# Patient Record
Sex: Female | Born: 1990 | Race: White | Hispanic: No | Marital: Single | Smoking: Former smoker
Health system: Southern US, Community
[De-identification: ages and names within clinical notes are randomized; demographics above are authoritative.]

## PROBLEM LIST (undated history)

## (undated) ENCOUNTER — Inpatient Hospital Stay (HOSPITAL_COMMUNITY): Payer: Medicaid Other

## (undated) ENCOUNTER — Inpatient Hospital Stay (HOSPITAL_COMMUNITY): Payer: Self-pay

## (undated) DIAGNOSIS — Z98891 History of uterine scar from previous surgery: Secondary | ICD-10-CM

## (undated) DIAGNOSIS — D649 Anemia, unspecified: Secondary | ICD-10-CM

## (undated) DIAGNOSIS — E559 Vitamin D deficiency, unspecified: Secondary | ICD-10-CM

## (undated) DIAGNOSIS — Z789 Other specified health status: Secondary | ICD-10-CM

## (undated) DIAGNOSIS — E041 Nontoxic single thyroid nodule: Secondary | ICD-10-CM

## (undated) DIAGNOSIS — R87629 Unspecified abnormal cytological findings in specimens from vagina: Secondary | ICD-10-CM

## (undated) HISTORY — DX: Anemia, unspecified: D64.9

## (undated) HISTORY — PX: WISDOM TOOTH EXTRACTION: SHX21

## (undated) HISTORY — DX: Vitamin D deficiency, unspecified: E55.9

## (undated) HISTORY — DX: Unspecified abnormal cytological findings in specimens from vagina: R87.629

## (undated) HISTORY — DX: Nontoxic single thyroid nodule: E04.1

---
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
## 2011-08-02 ENCOUNTER — Emergency Department (HOSPITAL_COMMUNITY)
Admission: EM | Admit: 2011-08-02 | Discharge: 2011-08-02 | Disposition: A | Discharge status: Home / Self Care | Payer: Self-pay | Attending: Emergency Medicine | Admitting: Emergency Medicine

## 2011-08-02 ENCOUNTER — Encounter (HOSPITAL_COMMUNITY): Payer: Self-pay | Admitting: *Deleted

## 2011-08-02 DIAGNOSIS — F17200 Nicotine dependence, unspecified, uncomplicated: Secondary | ICD-10-CM | POA: Insufficient documentation

## 2011-08-02 DIAGNOSIS — H109 Unspecified conjunctivitis: Secondary | ICD-10-CM | POA: Insufficient documentation

## 2011-08-02 MED ORDER — TOBRAMYCIN 0.3 % OP SOLN: 1.0000 [drp] | OPHTHALMIC | Status: AC

## 2011-08-02 MED ORDER — DIPHENHYDRAMINE HCL 25 MG PO CAPS
25.0000 mg | ORAL_CAPSULE | Freq: Once | ORAL | Status: AC
  Administered 2011-08-02: 25 mg via ORAL
  Filled 2011-08-02: qty 1

## 2011-08-02 NOTE — ED Provider Notes (Signed)
Medical screening examination/treatment/procedure(s) were performed by non-physician practitioner and as supervising physician I was immediately available for consultation/collaboration.  Dagny Fiorentino, MD 08/02/11 1533 

## 2011-08-02 NOTE — ED Provider Notes (Signed)
History     CSN: [id number]  Arrival date & time 08/02/11  4098   First MD Initiated Contact with Patient 08/02/11 0710      Chief Complaint  Patient presents with  . Eye Pain    (Consider location/radiation/quality/duration/timing/severity/associated sxs/prior treatment) HPI  Patient presents to ER complaining of a 2 day hx of gradual onset left eye burning, itching, redness and blurry vision with some watery discharge but states that "if feels like the right eye may start now." Patient denies known injury or FB to eye. She denies contact lens or eye glasses use. She has taken no medication PTA for symptoms. She denies aggravating or alleviating factors. Symptoms were gradual onset, persistent and unchanging. Patient states she has no known medical problems and takes no meds on regular basis. She denies headaches, fevers, chills, double vision or visual field changes, purulent drainage from eyes or photophobia.   History reviewed. No pertinent past medical history.  History reviewed. No pertinent past surgical history.  Family History  Problem Relation Age of Onset  . Hypertension Mother   . Hypertension Father   . Cancer Other     History  Substance Use Topics  . Smoking status: Current Everyday Smoker -- 0.5 packs/day  . Smokeless tobacco: Not on file  . Alcohol Use: No    OB History    Grav Para Term Preterm Abortions TAB SAB Ect Mult Living                  Review of Systems  All other systems reviewed and are negative.    Allergies  Review of patient's allergies indicates no known allergies.  Home Medications  No current outpatient prescriptions on file.  BP 123/75  Pulse 85  Temp(Src) 98.5 F (36.9 C) (Oral)  Resp 16  SpO2 100%  LMP 07/14/2011  Physical Exam  Vitals reviewed. Constitutional: She is oriented to person, place, and time. She appears well-developed and well-nourished. No distress.  HENT:  Head: Normocephalic and atraumatic.  Right  Ear: External ear normal.  Left Ear: External ear normal.  Nose: Nose normal.  Mouth/Throat: No oropharyngeal exudate.       Mild erythema of posterior pharynx and tonsils no tonsillar exudate or enlargement. Patent airway. Swallowing secretions well  Eyes: EOM are normal. Pupils are equal, round, and reactive to light. Right eye exhibits no discharge. No foreign body present in the right eye. Left eye exhibits discharge. No foreign body present in the left eye. Right conjunctiva is not injected. Right conjunctiva has no hemorrhage. Left conjunctiva is injected. Left conjunctiva has no hemorrhage.  Fundoscopic exam:      The right eye shows no exudate, no hemorrhage and no papilledema.       The left eye shows no exudate, no hemorrhage and no papilledema.       Clear watery d/c from left eye  Neck: Normal range of motion. Neck supple.  Cardiovascular: Normal rate, regular rhythm and normal heart sounds.  Exam reveals no gallop and no friction rub.   No murmur heard. Pulmonary/Chest: Effort normal and breath sounds normal. No respiratory distress. She has no wheezes. She has no rales. She exhibits no tenderness.  Abdominal: Soft. She exhibits no distension and no mass. There is no tenderness. There is no rebound and no guarding.  Lymphadenopathy:    She has no cervical adenopathy.  Neurological: She is alert and oriented to person, place, and time. She has normal reflexes.  Skin: Skin  is warm and dry. No rash noted. She is not diaphoretic.  Psychiatric: She has a normal mood and affect.    ED Course  Procedures (including critical care time)  PO benadryl.   Labs Reviewed - No data to display No results found.   1. Conjunctivitis       MDM  Injection and watery d/c from left eye most consistent with conjunctivitis in a non contact lens wearing patient. Patient agreeable to opthalmology follow up but returning to ER for changing or worsening of symptoms.         Lenon Oms  [place], [place] 08/02/11 [phone]

## 2011-08-02 NOTE — ED Notes (Addendum)
C/o bilateral eye redness, started with L eye, L eye worse, redness noted & watery,  some redness noted to R eye. Denies drainage, exudate or crustiness. Denies fever. Admits to blurry vision.

## 2011-08-02 NOTE — Discharge Instructions (Signed)
Use eye drops as directed. Apply warm compresses to eye throughout the day. Take benadryl every 6 hours. Follow up with opthalmology referral in 2-3 days for recheck of ongoing symptoms but return to ER for emergent changing or worsening of symptoms.   Conjunctivitis Conjunctivitis is commonly called "pink eye." Conjunctivitis can be caused by bacterial or viral infection, allergies, or injuries. There is usually redness of the lining of the eye, itching, discomfort, and sometimes discharge. There may be deposits of matter along the eyelids. A viral infection usually causes a watery discharge, while a bacterial infection causes a yellowish, thick discharge. Pink eye is very contagious and spreads by direct contact. You may be given antibiotic eyedrops as part of your treatment. Before using your eye medicine, remove all drainage from the eye by washing gently with warm water and cotton balls. Continue to use the medication until you have awakened 2 mornings in a row without discharge from the eye. Do not rub your eye. This increases the irritation and helps spread infection. Use separate towels from other household members. Wash your hands with soap and water before and after touching your eyes. Use cold compresses to reduce pain and sunglasses to relieve irritation from light. Do not wear contact lenses or wear eye makeup until the infection is gone. SEEK MEDICAL CARE IF:   Your symptoms are not better after 3 days of treatment.   You have increased pain or trouble seeing.   The outer eyelids become very red or swollen.  Document Released: 04/24/2004 Document Revised: 03/06/2011 Document Reviewed: 03/17/2005 [facility] Patient Information 2012 [place], [place].

## 2011-10-13 ENCOUNTER — Encounter (HOSPITAL_COMMUNITY): Payer: Self-pay | Admitting: Emergency Medicine

## 2011-10-13 ENCOUNTER — Emergency Department (HOSPITAL_COMMUNITY)
Admission: EM | Admit: 2011-10-13 | Discharge: 2011-10-13 | Disposition: A | Discharge status: Home / Self Care | Payer: Self-pay | Attending: Emergency Medicine | Admitting: Emergency Medicine

## 2011-10-13 DIAGNOSIS — F17200 Nicotine dependence, unspecified, uncomplicated: Secondary | ICD-10-CM | POA: Insufficient documentation

## 2011-10-13 DIAGNOSIS — N390 Urinary tract infection, site not specified: Secondary | ICD-10-CM | POA: Insufficient documentation

## 2011-10-13 DIAGNOSIS — I10 Essential (primary) hypertension: Secondary | ICD-10-CM | POA: Insufficient documentation

## 2011-10-13 LAB — URINE MICROSCOPIC-ADD ON

## 2011-10-13 LAB — URINALYSIS, ROUTINE W REFLEX MICROSCOPIC
Glucose, UA: NEGATIVE mg/dL
Ketones, ur: NEGATIVE mg/dL
pH: 6 (ref 5.0–8.0)

## 2011-10-13 LAB — CBC
HCT: 40.4 % (ref 36.0–46.0)
MCH: 29.6 pg (ref 26.0–34.0)
MCV: 89.2 fL (ref 78.0–100.0)
RBC: 4.53 MIL/uL (ref 3.87–5.11)
WBC: 10.1 10*3/uL (ref 4.0–10.5)

## 2011-10-13 LAB — BASIC METABOLIC PANEL
CO2: 26 mEq/L (ref 19–32)
Chloride: 103 mEq/L (ref 96–112)
Glucose, Bld: 137 mg/dL — ABNORMAL HIGH (ref 70–99)
Sodium: 140 mEq/L (ref 135–145)

## 2011-10-13 MED ORDER — CEPHALEXIN 500 MG PO CAPS: 500.0000 mg | ORAL_CAPSULE | Freq: Four times a day (QID) | ORAL | Status: AC

## 2011-10-13 NOTE — ED Provider Notes (Signed)
History     CSN: [id number]  Arrival date & time 10/13/11  0547   First MD Initiated Contact with Patient 10/13/11 0601      Chief Complaint  Patient presents with  . Dizziness  . Fatigue  . Chest Pain    (Consider location/radiation/quality/duration/timing/severity/associated sxs/prior treatment) The history is provided by the patient.   21 year old female who presents for dizziness and feeling like "shit." The dizziness is approximately 6 days in duration and first occurred while she was seated on her car outside a bowling alley. She felt like she would lose consciousness, but never did. It resolved spontaneously. Since that time, she has felt similar symptoms after standing for long periods of time while at work in [employer]. She denies syncope. Accompanying symptoms include a right sided headache, left sided chest pain, lower abdominal pain, blurry vision and polyuria. She denies nausea, vomiting, and diarrhea and notes increasing her water consumption. She was initially concerned that she was pregnant, but two home pregnancy tests have been negative. She takes no medications. She uses marijuana, but notes that these symptoms have persisted for several days since last using it. She denies using any other recreational drugs or alcohol.   Past Medical History  Diagnosis Date  . Hypertension     History reviewed. No pertinent past surgical history.  Family History  Problem Relation Age of Onset  . Hypertension Mother   . Hypertension Father   . Cancer Other     History  Substance Use Topics  . Smoking status: Current Everyday Smoker -- 0.5 packs/day  . Smokeless tobacco: Not on file  . Alcohol Use: No    OB History    Grav Para Term Preterm Abortions TAB SAB Ect Mult Living   1    1  1    0      Review of Systems  All other systems reviewed and are negative.    Allergies  Review of patient's allergies indicates no known allergies.  Home Medications  No  current outpatient prescriptions on file.  BP 127/65  Pulse 98  Temp 98.5 F (36.9 C) (Oral)  Resp 14  SpO2 99%  Physical Exam  Constitutional: She is oriented to person, place, and time. She appears well-developed and well-nourished. No distress.  HENT:  Head: Normocephalic and atraumatic.  Mouth/Throat: No oropharyngeal exudate.  Eyes: Conjunctivae and EOM are normal. Pupils are equal, round, and reactive to light. Right eye exhibits no nystagmus. Left eye exhibits no nystagmus.  Neck: Normal range of motion. Neck supple.  Cardiovascular: Normal rate, regular rhythm and normal heart sounds.   Pulmonary/Chest: Effort normal and breath sounds normal. She exhibits tenderness.       Chest wall tenderness to palpations to the left of the sternum  Abdominal: Soft. Bowel sounds are normal. There is no hepatosplenomegaly. There is tenderness in the left lower quadrant. There is no CVA tenderness, no tenderness at McBurney's point and negative Murphy's sign.  Musculoskeletal: Normal range of motion. She exhibits no edema and no tenderness.  Neurological: She is alert and oriented to person, place, and time. No cranial nerve deficit.  Skin: She is not diaphoretic.    ED Course  Procedures (including critical care time)  Labs Reviewed  URINALYSIS, ROUTINE W REFLEX MICROSCOPIC - Abnormal; Notable for the following:    APPearance CLOUDY (*)     Hgb urine dipstick SMALL (*)     Leukocytes, UA MODERATE (*)     All  other components within normal limits  BASIC METABOLIC PANEL - Abnormal; Notable for the following:    Glucose, Bld 137 (*)     All other components within normal limits  URINE MICROSCOPIC-ADD ON - Abnormal; Notable for the following:    Squamous Epithelial / LPF FEW (*)     All other components within normal limits  PREGNANCY, URINE  CBC   No results found.   1. Urinary tract infection       MDM  The patient is a 21 year old with symptoms of dizziness and abdominal  pain coupled with abdominal tenderness on exam and leukocytes on her urinalysis. This likely represented a urinary tract infection, for which she was prescribed cephalexin. Her other lab tests were within normal limits, aside from her glucose which was 137. Given that she was fasting at the time, this reading could be diagnostic of diabetes. However, this would not be accurate in the setting of an untreated UTI. I instructed the patient to follow up with a doctor to have her blood glucose checked in approximately 1 week. She was in agreement with this plan.         Garnetta Buddy, MD 10/13/11 [phone]

## 2011-10-13 NOTE — ED Provider Notes (Signed)
I  reviewed the resident's note and I agree with the findings and plan.     Nelia Shi, MD 10/13/11 [phone]

## 2011-10-13 NOTE — ED Notes (Addendum)
Pt reports dizziness and generalized malaise over the last week reports neg preg test x2  Admits to nausea but denies emesis. Pt reports chest soreness that increases with deep breathing

## 2011-10-16 ENCOUNTER — Emergency Department (HOSPITAL_COMMUNITY)
Admission: EM | Admit: 2011-10-16 | Discharge: 2011-10-16 | Disposition: A | Discharge status: Home / Self Care | Payer: Self-pay | Attending: Emergency Medicine | Admitting: Emergency Medicine

## 2011-10-16 ENCOUNTER — Emergency Department (HOSPITAL_COMMUNITY): Payer: Self-pay

## 2011-10-16 ENCOUNTER — Encounter (HOSPITAL_COMMUNITY): Payer: Self-pay | Admitting: Emergency Medicine

## 2011-10-16 DIAGNOSIS — M79609 Pain in unspecified limb: Secondary | ICD-10-CM | POA: Insufficient documentation

## 2011-10-16 DIAGNOSIS — R0789 Other chest pain: Secondary | ICD-10-CM

## 2011-10-16 DIAGNOSIS — M25512 Pain in left shoulder: Secondary | ICD-10-CM

## 2011-10-16 DIAGNOSIS — R071 Chest pain on breathing: Secondary | ICD-10-CM | POA: Insufficient documentation

## 2011-10-16 DIAGNOSIS — M25519 Pain in unspecified shoulder: Secondary | ICD-10-CM | POA: Insufficient documentation

## 2011-10-16 DIAGNOSIS — M79605 Pain in left leg: Secondary | ICD-10-CM

## 2011-10-16 DIAGNOSIS — I10 Essential (primary) hypertension: Secondary | ICD-10-CM | POA: Insufficient documentation

## 2011-10-16 DIAGNOSIS — F17200 Nicotine dependence, unspecified, uncomplicated: Secondary | ICD-10-CM | POA: Insufficient documentation

## 2011-10-16 MED ORDER — HYDROCODONE-ACETAMINOPHEN 5-325 MG PO TABS: 2.0000 | ORAL_TABLET | ORAL | Status: AC | PRN

## 2011-10-16 NOTE — ED Notes (Addendum)
Pt was seen at [facility] on 10/13/11 with the same issues dealing with pain and was dx with UTI and sent home on antibiotics. All other test were WNL and pt was discharged.

## 2011-10-16 NOTE — ED Provider Notes (Signed)
History     CSN: [id number]  Arrival date & time 10/16/11  0038   First MD Initiated Contact with Patient 10/16/11 0243      Chief Complaint  Patient presents with  . Shoulder Pain    (Consider location/radiation/quality/duration/timing/severity/associated sxs/prior treatment) HPI Comments: Patient presenting with left arm pain, left sided neck pain, left sided chest pain, and left leg pain for the past week.  Pain gradually worsening.  No erythema or edema.  No numbness or tingling.  No weakness.  No acute injury or trauma.  Chest pain worse with deep inspiration.  Left shoulder pain worse with movement of left shoulder.  Neck pain worse with movement.  She has not taken anything for pain. She is currently not on any estrogen containing medications.  No prior history of DVT or PE.  No prolonged travel or surgeries in the past 4 weeks.    The history is provided by the patient.    Past Medical History  Diagnosis Date  . Hypertension     History reviewed. No pertinent past surgical history.  Family History  Problem Relation Age of Onset  . Hypertension Mother   . Hypertension Father   . Cancer Other     History  Substance Use Topics  . Smoking status: Current Everyday Smoker -- 0.5 packs/day  . Smokeless tobacco: Not on file  . Alcohol Use: Yes     occasional    OB History    Grav Para Term Preterm Abortions TAB SAB Ect Mult Living   1    1  1    0      Review of Systems  Constitutional: Negative for fever and chills.  HENT: Positive for neck pain.   Respiratory: Negative for shortness of breath.   Cardiovascular: Positive for chest pain. Negative for palpitations and leg swelling.  Gastrointestinal: Negative for nausea and vomiting.  Musculoskeletal: Negative for back pain and gait problem.       Pain of the left leg and left arm  Skin: Negative for rash and wound.  Neurological: Negative for dizziness, syncope and light-headedness.    Allergies  Review of  patient's allergies indicates no known allergies.  Home Medications   Current Outpatient Rx  Name Route Sig Dispense Refill  . CEPHALEXIN 500 MG PO CAPS Oral Take 1 capsule (500 mg total) by mouth 4 (four) times daily. 14 capsule 0  . HYDROCODONE-ACETAMINOPHEN 5-325 MG PO TABS Oral Take 2 tablets by mouth every 4 (four) hours as needed for pain. 15 tablet 0    BP 135/80  Pulse 98  Temp 98.7 F (37.1 C) (Oral)  Resp 18  Ht 5\' 4"  (1.626 m)  Wt 130 lb (58.968 kg)  BMI 22.31 kg/m2  SpO2 97%  LMP 09/25/2011  Physical Exam  Nursing note and vitals reviewed. Constitutional: She appears well-developed and well-nourished. No distress.  HENT:  Head: Normocephalic and atraumatic.  Mouth/Throat: Oropharynx is clear and moist.  Neck: Normal range of motion. Neck supple.       Left sided muscular tenderness of the neck.  Cardiovascular: Normal rate, regular rhythm, normal heart sounds and intact distal pulses.   Pulmonary/Chest: Effort normal and breath sounds normal. No accessory muscle usage. Not tachypneic. No respiratory distress. She has no decreased breath sounds. She has no wheezes. She has no rhonchi. She has no rales. She exhibits tenderness.       Tenderness to palpation of the left chest  Musculoskeletal: Normal range of motion.  Left shoulder: She exhibits tenderness. She exhibits normal range of motion, no bony tenderness, no swelling, no effusion, no deformity, normal pulse and normal strength.       Left hip: She exhibits tenderness. She exhibits normal range of motion, normal strength, no bony tenderness, no swelling and no deformity.  Neurological: She is alert.  Skin: Skin is warm and dry. No rash noted. She is not diaphoretic. No erythema.  Psychiatric: She has a normal mood and affect.    ED Course  Procedures (including critical care time)  Labs Reviewed - No data to display Dg Chest 2 View  10/16/2011  *RADIOLOGY REPORT*  Clinical Data: Fever and body aches  for 1 week.  CHEST - 2 VIEW  Comparison: 07/22/2004  Findings: The heart size and pulmonary vascularity are normal. The lungs appear clear and expanded without focal air space disease or consolidation. No blunting of the costophrenic angles.  No pneumothorax.  IMPRESSION: No evidence of active pulmonary disease.  Original Report Authenticated By: Marlon Pel, M.D.     1. Left shoulder pain   2. Left leg pain   3. Left-sided chest wall pain      Date: 10/16/2011  Rate: 101  Rhythm: sinus tachycardia  QRS Axis: normal  Intervals: normal  ST/T Wave abnormalities: normal  Conduction Disutrbances:none  Narrative Interpretation:   Old EKG Reviewed: none available   MDM  Patient presenting with pain of the left side of her neck, left side of her chest, left arm, and left leg.  Symptoms have been present for the past week.  Normal EKG.  Negative CXR.  Normal ROM of all extremities.  Normal Neurological exam.  Therefore, feel that patient can be discharged home with PCP follow up.        Pascal Lux [place], PA-C 10/18/11 [phone]

## 2011-10-16 NOTE — ED Notes (Signed)
Pt c/o pain to L shoulder, L ribs with deep inspiration, denies injury

## 2011-10-21 NOTE — ED Provider Notes (Signed)
Medical screening examination/treatment/procedure(s) were performed by non-physician practitioner and as supervising physician I was immediately available for consultation/collaboration.  Izaiah Tabb M Ivory Maduro, MD 10/21/11 0729 

## 2013-05-02 IMAGING — CR DG CHEST 2V
2 series · 2 of 2 positions shown · non-contrast
Comparison: 07/22/2004

CLINICAL DATA: Fever and body aches for 1 week.

CHEST - 2 VIEW

[w chest pa]
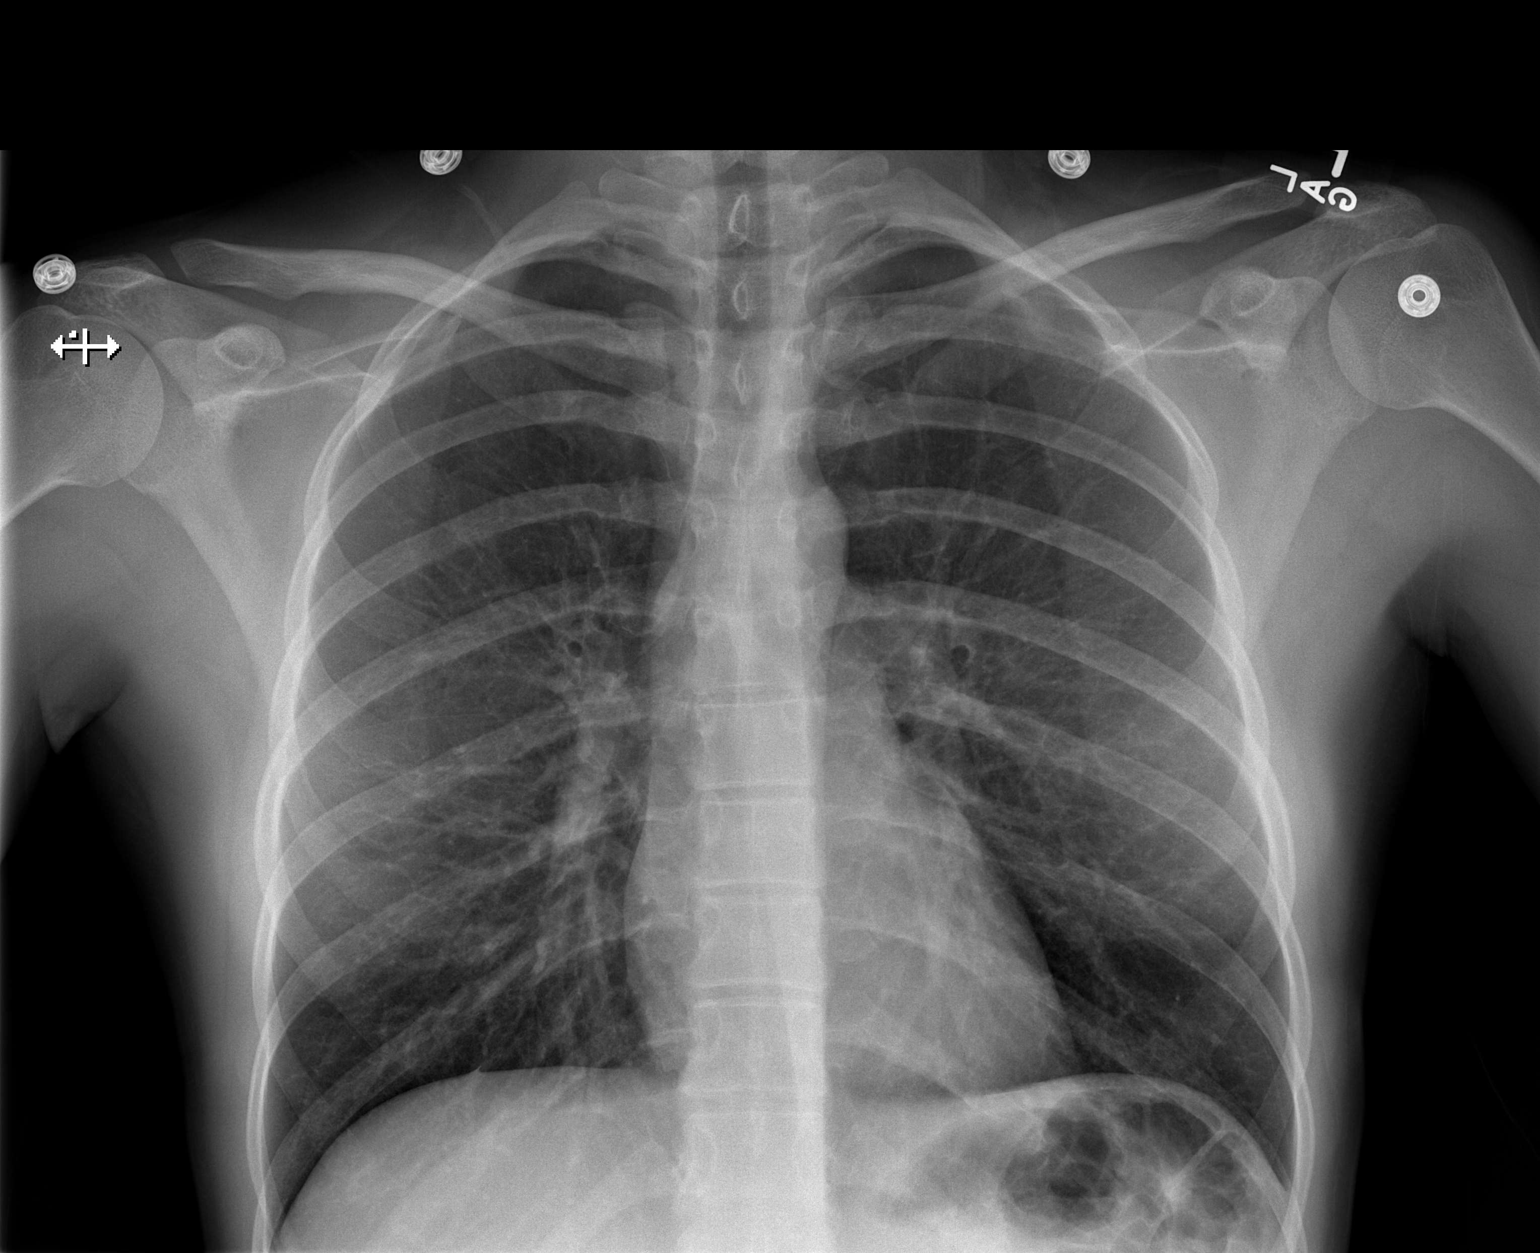

[w chest lat]
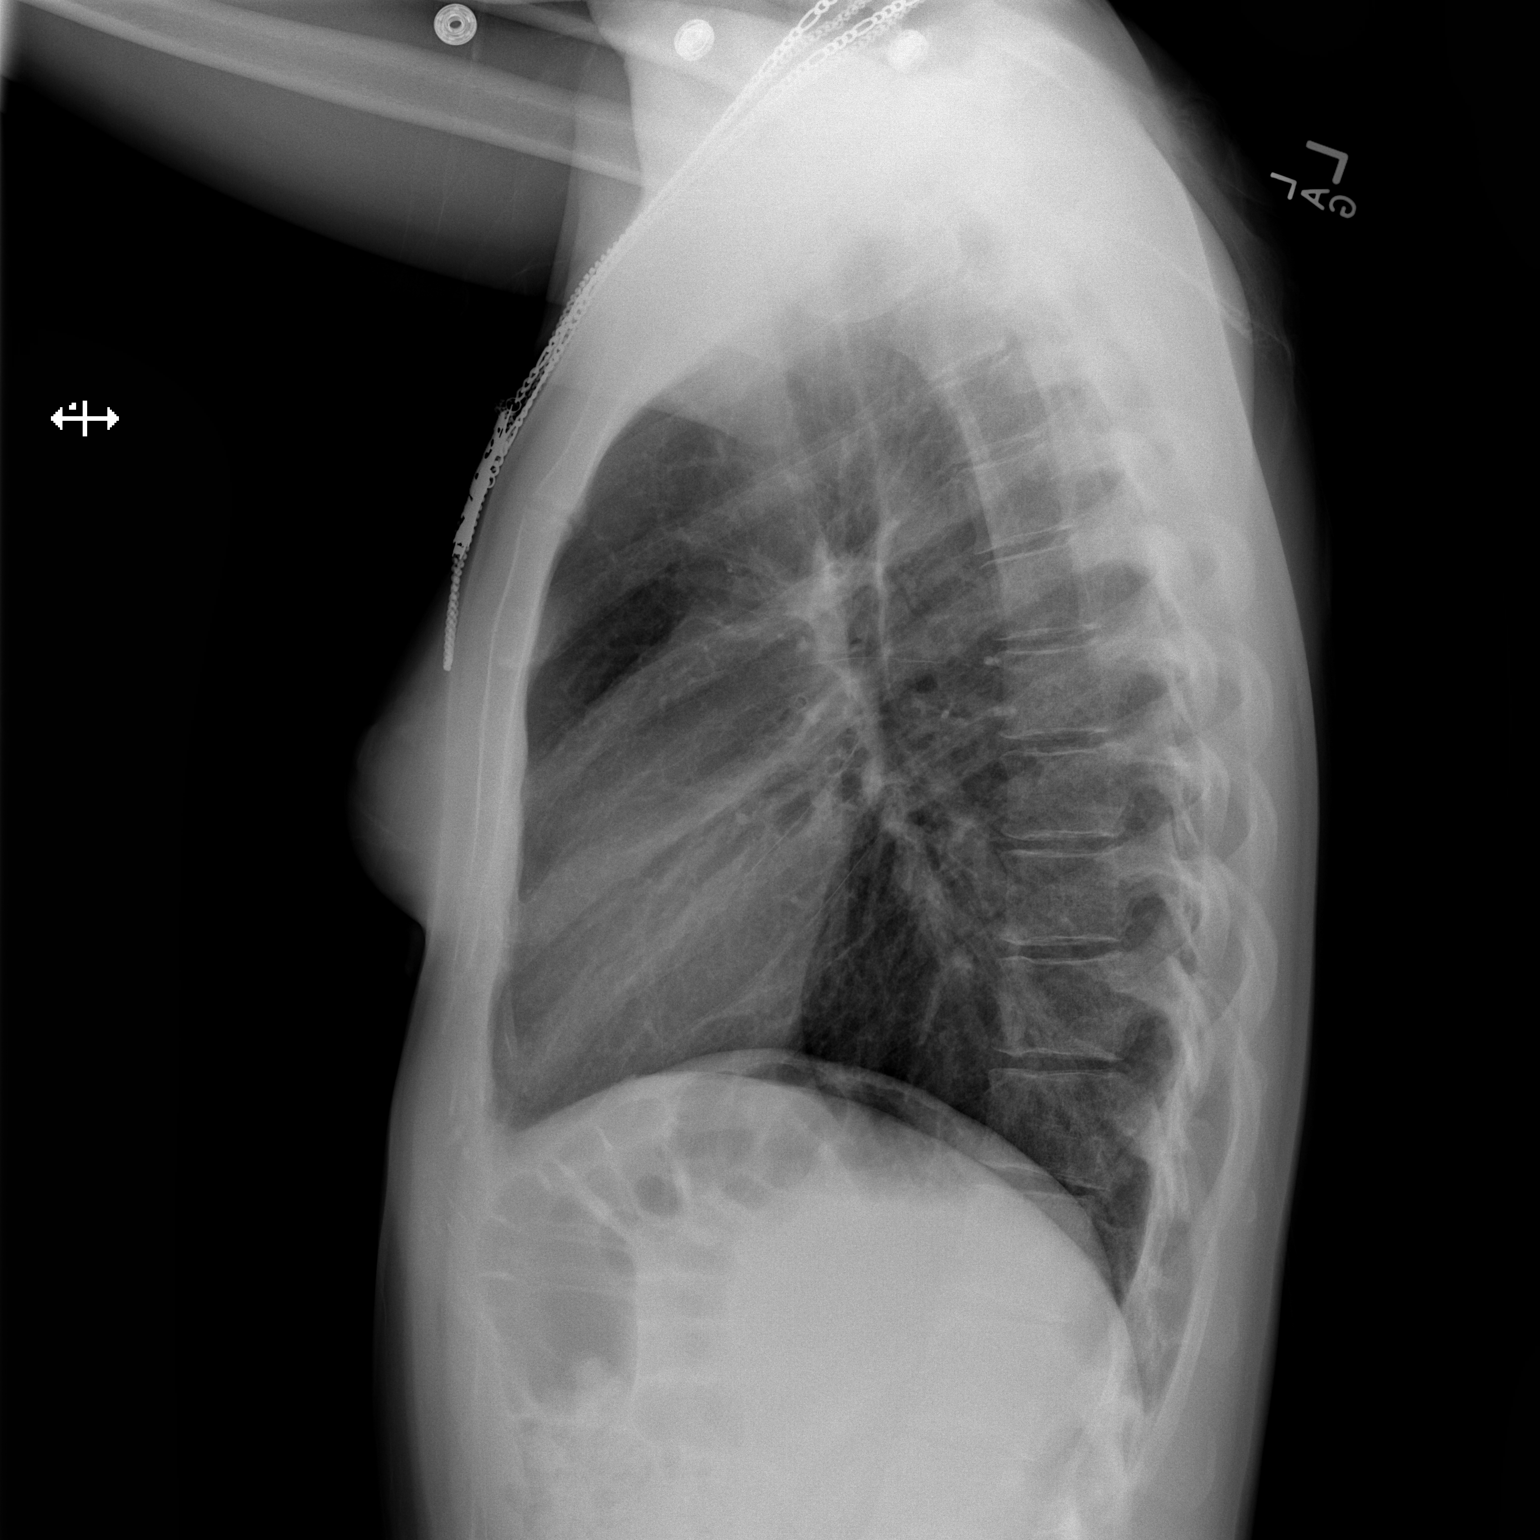

[2 of 2 positions shown; findings below may reference images not displayed]

FINDINGS: The heart size and pulmonary vascularity are normal. The
lungs appear clear and expanded without focal air space disease or
consolidation. No blunting of the costophrenic angles.  No
pneumothorax.
IMPRESSION: No evidence of active pulmonary disease.

## 2013-05-24 LAB — OB RESULTS CONSOLE RPR: RPR: NONREACTIVE

## 2013-05-24 LAB — OB RESULTS CONSOLE ABO/RH: RH TYPE: POSITIVE

## 2013-05-24 LAB — OB RESULTS CONSOLE GC/CHLAMYDIA
Chlamydia: NEGATIVE
Gonorrhea: NEGATIVE

## 2013-05-24 LAB — OB RESULTS CONSOLE RUBELLA ANTIBODY, IGM: RUBELLA: NON-IMMUNE/NOT IMMUNE

## 2013-05-24 LAB — OB RESULTS CONSOLE HEPATITIS B SURFACE ANTIGEN: HEP B S AG: NEGATIVE

## 2013-05-24 LAB — OB RESULTS CONSOLE HIV ANTIBODY (ROUTINE TESTING): HIV: NONREACTIVE

## 2013-05-24 LAB — OB RESULTS CONSOLE ANTIBODY SCREEN: Antibody Screen: NEGATIVE

## 2013-06-07 ENCOUNTER — Other Ambulatory Visit (HOSPITAL_COMMUNITY): Payer: Self-pay | Admitting: Obstetrics and Gynecology

## 2013-06-07 DIAGNOSIS — E041 Nontoxic single thyroid nodule: Secondary | ICD-10-CM

## 2013-06-10 ENCOUNTER — Ambulatory Visit (HOSPITAL_COMMUNITY)
Admission: RE | Admit: 2013-06-10 | Discharge: 2013-06-10 | Disposition: A | Discharge status: Home / Self Care | Payer: Medicaid Other | Source: Ambulatory Visit | Attending: Obstetrics and Gynecology | Admitting: Obstetrics and Gynecology

## 2013-06-10 DIAGNOSIS — E041 Nontoxic single thyroid nodule: Secondary | ICD-10-CM

## 2013-06-10 DIAGNOSIS — E079 Disorder of thyroid, unspecified: Secondary | ICD-10-CM | POA: Insufficient documentation

## 2013-06-13 ENCOUNTER — Other Ambulatory Visit: Payer: Self-pay | Admitting: Obstetrics and Gynecology

## 2013-06-13 DIAGNOSIS — E041 Nontoxic single thyroid nodule: Secondary | ICD-10-CM

## 2013-06-15 ENCOUNTER — Ambulatory Visit
Admission: RE | Admit: 2013-06-15 | Discharge: 2013-06-15 | Disposition: A | Discharge status: Home / Self Care | Payer: Medicaid Other | Source: Ambulatory Visit | Attending: Obstetrics and Gynecology | Admitting: Obstetrics and Gynecology

## 2013-06-15 ENCOUNTER — Other Ambulatory Visit (HOSPITAL_COMMUNITY)
Admission: RE | Admit: 2013-06-15 | Discharge: 2013-06-15 | Disposition: A | Discharge status: Home / Self Care | Payer: Medicaid Other | Source: Ambulatory Visit | Attending: Diagnostic Radiology | Admitting: Diagnostic Radiology

## 2013-06-15 DIAGNOSIS — E041 Nontoxic single thyroid nodule: Secondary | ICD-10-CM

## 2013-08-24 ENCOUNTER — Encounter (HOSPITAL_COMMUNITY): Payer: Self-pay | Admitting: *Deleted

## 2013-08-24 ENCOUNTER — Inpatient Hospital Stay (HOSPITAL_COMMUNITY)
Admission: AD | Admit: 2013-08-24 | Discharge: 2013-08-25 | Disposition: A | Discharge status: Home / Self Care | Payer: Medicaid Other | Source: Ambulatory Visit | Attending: Obstetrics and Gynecology | Admitting: Obstetrics and Gynecology

## 2013-08-24 DIAGNOSIS — O99891 Other specified diseases and conditions complicating pregnancy: Secondary | ICD-10-CM | POA: Insufficient documentation

## 2013-08-24 DIAGNOSIS — L559 Sunburn, unspecified: Secondary | ICD-10-CM | POA: Insufficient documentation

## 2013-08-24 DIAGNOSIS — L551 Sunburn of second degree: Secondary | ICD-10-CM

## 2013-08-24 DIAGNOSIS — O10019 Pre-existing essential hypertension complicating pregnancy, unspecified trimester: Secondary | ICD-10-CM | POA: Insufficient documentation

## 2013-08-24 DIAGNOSIS — O9989 Other specified diseases and conditions complicating pregnancy, childbirth and the puerperium: Principal | ICD-10-CM

## 2013-08-24 DIAGNOSIS — O99330 Smoking (tobacco) complicating pregnancy, unspecified trimester: Secondary | ICD-10-CM | POA: Insufficient documentation

## 2013-08-24 MED ORDER — BENZOCAINE-MENTHOL 20-0.5 % EX AERO
1.0000 "application " | INHALATION_SPRAY | Freq: Once | CUTANEOUS | Status: AC
  Administered 2013-08-25: 1 via TOPICAL
  Filled 2013-08-24: qty 56

## 2013-08-24 MED ORDER — IBUPROFEN 600 MG PO TABS
600.0000 mg | ORAL_TABLET | Freq: Once | ORAL | Status: AC
  Administered 2013-08-25: 600 mg via ORAL
  Filled 2013-08-24 (×2): qty 1

## 2013-08-24 NOTE — MAU Note (Signed)
Went to the pool for about 1.5 hours. No sunscreen.  Denies VB/LOF/contractions

## 2013-08-25 DIAGNOSIS — L551 Sunburn of second degree: Secondary | ICD-10-CM

## 2013-08-25 MED ORDER — IBUPROFEN 600 MG PO TABS: 600.0000 mg | ORAL_TABLET | Freq: Four times a day (QID) | ORAL | Status: DC

## 2013-08-25 NOTE — MAU Provider Note (Signed)
Chief Complaint:  Sunburn   First Provider Initiated Contact with Patient 08/25/13 0011     HPI: Valerie Chapman is a 23 y.o. G2P0010 at [redacted]w[redacted]d who presents to maternity admissions reporting severe sunburn on arms, chest and anterior thighs. Blister on right shoulder. Did Not wear sunscreen. Denies contractions, leakage of fluid or vaginal bleeding. Good fetal movement.   Did not call office. Came to MAU because she was worried about blisters.  Past Medical History: Past Medical History  Diagnosis Date  . Hypertension     Past obstetric history: OB History  Gravida Para Term Preterm AB SAB TAB Ectopic Multiple Living  2    1 1     0    # Outcome Date GA Lbr Len/2nd Weight Sex Delivery Anes PTL Lv  2 CUR           1 SAB 2003              Past Surgical History: History reviewed. No pertinent past surgical history.   Family History: Family History  Problem Relation Age of Onset  . Hypertension Mother   . Hypertension Father   . Cancer Other     Social History: History  Substance Use Topics  . Smoking status: Current Some Day Smoker -- 0.50 packs/day  . Smokeless tobacco: Not on file  . Alcohol Use: No     Comment: occasional    Allergies: No Known Allergies  Meds:  No prescriptions prior to admission    ROS: Pertinent findings in history of present illness.  Physical Exam  Blood pressure 132/70, pulse 108, temperature 97.1 F (36.2 C), temperature source Oral, resp. rate 18, height 5' 4.5" (1.638 m), weight 74.662 kg (164 lb 9.6 oz), last menstrual period 02/10/2013. GENERAL: Well-developed, well-nourished female in no acute distress.  HEENT: normocephalic. Mucous membranes moist. HEART: normal rate RESP: normal effort ABDOMEN: Soft, non-tender, gravid appropriate for gestational age.  SKIN: Erythema covering bilateral upper arms, shoulders, bilateral thighs and upper chest arms in exposed areas only. Mild edema. 1x3 cm intact blister containing clear fluid on  front of right shoulder. NEURO: alert and oriented SPECULUM EXAM: Deferred   FHT:  Baseline 130 , moderate variability, accelerations present, no decelerations Contractions: rare, painless UC's. Pt unaware.    Labs: N/A  Imaging:  No results found.  MAU Course: Ibuprofen and dermoplast given in MAU.  Assessment: 1. Sunburn, blistering   2. Other current maternal conditions classifiable elsewhere, antepartum     Plan: Discharge home in stable condition. Ibuprofen x48 hours. Cool compresses and baths. Dermoplast or aloe gel as needed. Discussed increased skin sensitivity in pregnancy and strongly encouraged patient to minimize sun exposure and use strong sunblock when exposed to sun. Apply Neosporin and loose gauze dressing as needed when blisters pop. Preterm labor precautions and fetal kick counts Push fluids.     Follow-up Information   Follow up with [facility]. (As scheduled or as needed if symptoms worsen.)    Contact information:   [address]  [address] [place] [address] [phone]       Follow up with [facility] [place] [facility]. (As needed in emergencies)    Contact information:   [address] [id number] [place] [address] [phone]       Medication List         ibuprofen 600 MG tablet  Commonly known as:  ADVIL,MOTRIN  Take 1 tablet (600 mg total) by  mouth 4 (four) times daily. X 48 hours. Do not use after [redacted] weeks gestation.        [place] Nitara Szczerba, CNM 08/25/2013 12:17 AM

## 2013-08-25 NOTE — Discharge Instructions (Signed)
Sunburn Sunburn is damage to the skin caused by overexposure to ultraviolet (UV) rays. People with light skin or a fair complexion may be more susceptible to sunburn. Repeated sun exposure causes early skin aging such as wrinkles and sun spots. It also increases the risk of skin cancer. CAUSES A sunburn is caused by getting too much UV radiation from the sun. SYMPTOMS  Red or pink skin.  Soreness and swelling.  Pain.  Blisters.  Peeling skin.  Headache, fever, and fatigue if sunburn covers a large area. TREATMENT  Your caregiver may tell you to take certain medicines to lessen inflammation.  Your caregiver may have you use hydrocortisone cream or spray to help with itching and inflammation.  Your caregiver may prescribe an antibiotic cream to use on blisters. HOME CARE INSTRUCTIONS   Avoid further exposure to the sun.  Cool baths and cool compresses may be helpful if used several times per day. Do not apply ice, since this may result in more damage to the skin.  Only take over-the-counter or prescription medicines for pain, discomfort, or fever as directed by your caregiver.  Use aloe or other over-the-counter sunburn creams or gels on your skin. Do not apply these creams or gels on blisters.  Drink enough fluids to keep your urine clear or pale yellow.  Do not break blisters. If blisters break, your caregiver may recommend an antibiotic cream (Neosporin) to apply to the affected area. PREVENTION   Try to avoid the sun between 10:00 a.m. and 4:00 p.m. when it is the strongest.  Apply sunscreen at least 30 minutes before exposure to the sun.  Always wear protective hats, clothing, and sunglasses with UV protection.  Avoid medicines, herbs, and foods that increase your sensitivity to sunlight.  Avoid tanning beds. SEEK IMMEDIATE MEDICAL CARE IF:   You have a fever.  Your pain is uncontrolled with medicine.  You start to vomit or have diarrhea.  You feel faint or  develop a headache with confusion.  You develop severe blistering.  You have a pus-like (purulent) discharge coming from the blisters.  Your burn becomes more painful and swollen. MAKE SURE YOU:  Understand these instructions.  Will watch your condition.  Will get help right away if you are not doing well or get worse. Document Released: 12/25/2004 Document Revised: 07/12/2012 Document Reviewed: 09/08/2010 [facility] Patient Information 2014 [place], [place].

## 2013-10-26 LAB — OB RESULTS CONSOLE GBS: GBS: NEGATIVE

## 2013-11-16 ENCOUNTER — Inpatient Hospital Stay (HOSPITAL_COMMUNITY)
Admission: AD | Admit: 2013-11-16 | Discharge: 2013-11-20 | DRG: 765 | Disposition: A | Discharge status: Home / Self Care | Payer: Medicaid Other | Source: Ambulatory Visit | Attending: Obstetrics and Gynecology | Admitting: Obstetrics and Gynecology

## 2013-11-16 ENCOUNTER — Inpatient Hospital Stay (HOSPITAL_COMMUNITY): Payer: Medicaid Other | Admitting: Anesthesiology

## 2013-11-16 ENCOUNTER — Encounter (HOSPITAL_COMMUNITY): Payer: Medicaid Other | Admitting: Anesthesiology

## 2013-11-16 ENCOUNTER — Encounter (HOSPITAL_COMMUNITY): Payer: Self-pay | Admitting: *Deleted

## 2013-11-16 DIAGNOSIS — Z98891 History of uterine scar from previous surgery: Secondary | ICD-10-CM

## 2013-11-16 DIAGNOSIS — O864 Pyrexia of unknown origin following delivery: Secondary | ICD-10-CM | POA: Diagnosis not present

## 2013-11-16 DIAGNOSIS — O4290 Premature rupture of membranes, unspecified as to length of time between rupture and onset of labor, unspecified weeks of gestation: Secondary | ICD-10-CM | POA: Diagnosis present

## 2013-11-16 DIAGNOSIS — Z8249 Family history of ischemic heart disease and other diseases of the circulatory system: Secondary | ICD-10-CM

## 2013-11-16 HISTORY — DX: History of uterine scar from previous surgery: Z98.891

## 2013-11-16 HISTORY — DX: Other specified health status: Z78.9

## 2013-11-16 LAB — CBC
HEMATOCRIT: 36.1 % (ref 36.0–46.0)
Hemoglobin: 12.5 g/dL (ref 12.0–15.0)
MCH: 31.1 pg (ref 26.0–34.0)
MCHC: 34.6 g/dL (ref 30.0–36.0)
MCV: 89.8 fL (ref 78.0–100.0)
Platelets: 244 10*3/uL (ref 150–400)
RBC: 4.02 MIL/uL (ref 3.87–5.11)
RDW: 13.6 % (ref 11.5–15.5)
WBC: 13.8 10*3/uL — ABNORMAL HIGH (ref 4.0–10.5)

## 2013-11-16 LAB — POCT FERN TEST: POCT Fern Test: POSITIVE

## 2013-11-16 LAB — RPR

## 2013-11-16 MED ORDER — LACTATED RINGERS IV SOLN
INTRAVENOUS | Status: DC
  Administered 2013-11-16 – 2013-11-17 (×2): via INTRAVENOUS

## 2013-11-16 MED ORDER — FLEET ENEMA 7-19 GM/118ML RE ENEM: 1.0000 | ENEMA | RECTAL | Status: DC | PRN

## 2013-11-16 MED ORDER — TERBUTALINE SULFATE 1 MG/ML IJ SOLN: 0.2500 mg | Freq: Once | INTRAMUSCULAR | Status: AC | PRN

## 2013-11-16 MED ORDER — DIPHENHYDRAMINE HCL 50 MG/ML IJ SOLN: 12.5000 mg | INTRAMUSCULAR | Status: DC | PRN

## 2013-11-16 MED ORDER — LACTATED RINGERS IV SOLN: 500.0000 mL | INTRAVENOUS | Status: DC | PRN

## 2013-11-16 MED ORDER — CITRIC ACID-SODIUM CITRATE 334-500 MG/5ML PO SOLN
30.0000 mL | ORAL | Status: DC | PRN
  Administered 2013-11-16 – 2013-11-17 (×2): 30 mL via ORAL
  Filled 2013-11-16 (×2): qty 15

## 2013-11-16 MED ORDER — IBUPROFEN 600 MG PO TABS: 600.0000 mg | ORAL_TABLET | Freq: Four times a day (QID) | ORAL | Status: DC | PRN

## 2013-11-16 MED ORDER — BUTORPHANOL TARTRATE 1 MG/ML IJ SOLN
1.0000 mg | INTRAMUSCULAR | Status: DC | PRN
  Administered 2013-11-16 (×4): 1 mg via INTRAVENOUS
  Filled 2013-11-16 (×4): qty 1

## 2013-11-16 MED ORDER — EPHEDRINE 5 MG/ML INJ: 10.0000 mg | INTRAVENOUS | Status: DC | PRN

## 2013-11-16 MED ORDER — LIDOCAINE HCL (PF) 1 % IJ SOLN: 30.0000 mL | INTRAMUSCULAR | Status: DC | PRN

## 2013-11-16 MED ORDER — ONDANSETRON HCL 4 MG/2ML IJ SOLN
4.0000 mg | Freq: Four times a day (QID) | INTRAMUSCULAR | Status: DC | PRN
Start: 2013-11-16 — End: 2013-11-17
  Administered 2013-11-16 – 2013-11-17 (×2): 4 mg via INTRAVENOUS
  Filled 2013-11-16 (×2): qty 2

## 2013-11-16 MED ORDER — FENTANYL 2.5 MCG/ML BUPIVACAINE 1/10 % EPIDURAL INFUSION (WH - ANES)
14.0000 mL/h | INTRAMUSCULAR | Status: DC | PRN
  Administered 2013-11-17 (×3): 14 mL/h via EPIDURAL
  Filled 2013-11-16 (×3): qty 125

## 2013-11-16 MED ORDER — LACTATED RINGERS IV SOLN
500.0000 mL | Freq: Once | INTRAVENOUS | Status: AC
  Administered 2013-11-16: 500 mL via INTRAVENOUS

## 2013-11-16 MED ORDER — OXYTOCIN 40 UNITS IN LACTATED RINGERS INFUSION - SIMPLE MED
1.0000 m[IU]/min | INTRAVENOUS | Status: DC
  Administered 2013-11-16: 2 m[IU]/min via INTRAVENOUS
  Administered 2013-11-17: 14 m[IU]/min via INTRAVENOUS
  Filled 2013-11-16: qty 1000

## 2013-11-16 MED ORDER — OXYTOCIN BOLUS FROM INFUSION: 500.0000 mL | INTRAVENOUS | Status: DC

## 2013-11-16 MED ORDER — PHENYLEPHRINE 40 MCG/ML (10ML) SYRINGE FOR IV PUSH (FOR BLOOD PRESSURE SUPPORT): 80.0000 ug | PREFILLED_SYRINGE | INTRAVENOUS | Status: DC | PRN

## 2013-11-16 MED ORDER — ACETAMINOPHEN 325 MG PO TABS: 650.0000 mg | ORAL_TABLET | ORAL | Status: DC | PRN

## 2013-11-16 MED ORDER — PHENYLEPHRINE 40 MCG/ML (10ML) SYRINGE FOR IV PUSH (FOR BLOOD PRESSURE SUPPORT)
80.0000 ug | PREFILLED_SYRINGE | INTRAVENOUS | Status: DC | PRN
  Filled 2013-11-16: qty 10

## 2013-11-16 MED ORDER — OXYCODONE-ACETAMINOPHEN 5-325 MG PO TABS: 1.0000 | ORAL_TABLET | ORAL | Status: DC | PRN

## 2013-11-16 NOTE — H&P (Signed)
Valerie Chapman is a 23 y.o. female G2P0010 at 6038 6/7 weeks (EDD 11/24/13 by LMP c/w 14+ week US) presenting for SROM.  Pt had some possible LOF yesterday PM, but that resolved and then had gross ROM at 2am and came in to MAU.  Was admitted and pitocin augmentation ordered, but just got to L&D about 700am as they were busy and pitocin just started.  Feeling mild contractions only.  Fluid is possibly lightly stained with meconium.  Prenatal care essentially uncomplicated, had trichomonas in first trimester and was treated.  A thyroid nodule was noted on intial exam but a biopsy returned benign.  She is rubella non-immune and a smoker of 1/2 ppd   Maternal Medical History:  Reason for admission: Rupture of membranes.   Contractions: Onset was 3-5 hours ago.   Frequency: regular.   Perceived severity is mild.    Fetal activity: Perceived fetal activity is normal.    Prenatal Complications - Diabetes: none.    OB History   Grav Para Term Preterm Abortions TAB SAB Ect Mult Living   2    1  1    0    EAB x 1  Past Medical History  Diagnosis Date  . Medical history non-contributory    Past Surgical History  Procedure Laterality Date  . Wisdom tooth extraction Bilateral    Family History: family history includes Cancer in her other; Hypertension in her father and mother. Social History:  reports that she has quit smoking. Her smoking use included Cigarettes. She smoked 0.50 packs per day. She has never used smokeless tobacco. She reports that she does not drink alcohol or use illicit drugs.   Prenatal Transfer Tool  Maternal Diabetes: No Genetic Screening: Normal Maternal Ultrasounds/Referrals: Normal Fetal Ultrasounds or other Referrals:  None Maternal Substance Abuse:  Yes:  Type: Smoker Significant Maternal Medications:  None Significant Maternal Lab Results:  None Other Comments:  None  ROS  Dilation: 1 Effacement (%): 50 Station: -2 Exam by:: Weston,RN Blood pressure  137/84, pulse 65, temperature 97.8 F (36.6 C), temperature source Oral, resp. rate 20, height 5\' 4"  (1.626 m), weight 83.915 kg (185 lb), last menstrual period 02/10/2013, SpO2 100.00%. Maternal Exam:  Uterine Assessment: Contraction strength is mild.  Contraction frequency is regular.   Abdomen: Patient reports no abdominal tenderness. Fetal presentation: vertex  Introitus: Normal vulva. Normal vagina.  Ferning test: positive.  Nitrazine test: positive. Amniotic fluid character: meconium stained.     Physical Exam  Constitutional: She appears well-developed.  Cardiovascular: Normal rate.   Respiratory: Effort normal.  GI: Soft.  Neurological: She is alert.  Psychiatric: She has a normal mood and affect.    Prenatal labs: ABO, Rh: A/Positive/-- (02/24 0000) Antibody: Negative (02/24 0000) Rubella: Nonimmune (02/24 0000)* RPR: Nonreactive (02/24 0000)  HBsAg: Negative (02/24 0000)  HIV: Non-reactive (02/24 0000)  GBS: Negative (07/29 0000)  First trimester screen and AFP negative One hour GTT 101 Assessment/Plan: Pt here for PROM and now on pitocin for augmentation.  She will need rubella vaccine pp.  Oliver PilaRICHARDSON,Abbigal Radich W 11/16/2013, 7:55 AM

## 2013-11-16 NOTE — Progress Notes (Signed)
Ctx are stronger Afeb, VSS FHT- Cat I, ctx q 3 min VE-3/5-/-2, vtx, AROM forebag with light meconium Continue pitocin and monitor progress

## 2013-11-16 NOTE — Progress Notes (Signed)
Feeling ctx, has had IV Stadol Afeb, VSS FHT- Cat I VE-4/60/-2, vtx Continue pitocin and monitor progress

## 2013-11-16 NOTE — Progress Notes (Signed)
Feeling some ctx Afeb, VSS FHT-Cat I VE-1-2/50/-2, vtx, ? forebag Will continue pitocin and monitor progress

## 2013-11-16 NOTE — Anesthesia Preprocedure Evaluation (Addendum)
Anesthesia Evaluation  Patient identified by MRN, date of birth, ID band Patient awake    Reviewed: Allergy & Precautions, H&P , Patient's Chart, lab work & pertinent test results  Airway Mallampati: II TM Distance: >3 FB Neck ROM: full    Dental   Pulmonary former smoker,  breath sounds clear to auscultation        Cardiovascular Rhythm:regular Rate:Normal     Neuro/Psych    GI/Hepatic   Endo/Other    Renal/GU      Musculoskeletal   Abdominal   Peds  Hematology   Anesthesia Other Findings   Reproductive/Obstetrics (+) Pregnancy                          Anesthesia Physical Anesthesia Plan  ASA: II and emergent  Anesthesia Plan: Epidural   Post-op Pain Management:    Induction:   Airway Management Planned: Natural Airway  Additional Equipment:   Intra-op Plan:   Post-operative Plan:   Informed Consent: I have reviewed the patients History and Physical, chart, labs and discussed the procedure including the risks, benefits and alternatives for the proposed anesthesia with the patient or authorized representative who has indicated his/her understanding and acceptance.     Plan Discussed with: Anesthesiologist, CRNA and Surgeon  Anesthesia Plan Comments:        Anesthesia Quick Evaluation

## 2013-11-16 NOTE — MAU Note (Signed)
ROM at [facility]0215

## 2013-11-17 ENCOUNTER — Encounter (HOSPITAL_COMMUNITY): Payer: Self-pay | Admitting: *Deleted

## 2013-11-17 ENCOUNTER — Encounter (HOSPITAL_COMMUNITY)
Admission: AD | Disposition: A | Discharge status: Home / Self Care | Payer: Self-pay | Source: Ambulatory Visit | Attending: Obstetrics and Gynecology

## 2013-11-17 DIAGNOSIS — Z98891 History of uterine scar from previous surgery: Secondary | ICD-10-CM

## 2013-11-17 HISTORY — DX: History of uterine scar from previous surgery: Z98.891

## 2013-11-17 SURGERY — Surgical Case
Anesthesia: Epidural

## 2013-11-17 MED ORDER — FENTANYL CITRATE 0.05 MG/ML IJ SOLN
INTRAMUSCULAR | Status: AC
  Filled 2013-11-17: qty 2

## 2013-11-17 MED ORDER — ONDANSETRON HCL 4 MG PO TABS: 4.0000 mg | ORAL_TABLET | ORAL | Status: DC | PRN

## 2013-11-17 MED ORDER — KETAMINE HCL 10 MG/ML IJ SOLN
INTRAMUSCULAR | Status: DC | PRN
  Administered 2013-11-17 (×3): 20 mg via INTRAVENOUS

## 2013-11-17 MED ORDER — SIMETHICONE 80 MG PO CHEW
80.0000 mg | CHEWABLE_TABLET | ORAL | Status: DC | PRN
  Filled 2013-11-17: qty 1

## 2013-11-17 MED ORDER — SODIUM BICARBONATE 8.4 % IV SOLN
INTRAVENOUS | Status: DC | PRN
  Administered 2013-11-17 (×5): 5 mL via EPIDURAL

## 2013-11-17 MED ORDER — SCOPOLAMINE 1 MG/3DAYS TD PT72
MEDICATED_PATCH | TRANSDERMAL | Status: AC
  Filled 2013-11-17: qty 1

## 2013-11-17 MED ORDER — MORPHINE SULFATE (PF) 0.5 MG/ML IJ SOLN
INTRAMUSCULAR | Status: DC | PRN
  Administered 2013-11-17: 1 mg via EPIDURAL
  Administered 2013-11-17: 3 mg via EPIDURAL
  Administered 2013-11-17: .85 mg via EPIDURAL

## 2013-11-17 MED ORDER — OXYTOCIN 40 UNITS IN LACTATED RINGERS INFUSION - SIMPLE MED: 62.5000 mL/h | INTRAVENOUS | Status: AC

## 2013-11-17 MED ORDER — DIBUCAINE 1 % RE OINT: 1.0000 "application " | TOPICAL_OINTMENT | RECTAL | Status: DC | PRN

## 2013-11-17 MED ORDER — SODIUM BICARBONATE 8.4 % IV SOLN
INTRAVENOUS | Status: AC
  Filled 2013-11-17: qty 50

## 2013-11-17 MED ORDER — LACTATED RINGERS IV SOLN
INTRAVENOUS | Status: DC
  Administered 2013-11-17 – 2013-11-18 (×2): via INTRAVENOUS

## 2013-11-17 MED ORDER — KETAMINE HCL 10 MG/ML IJ SOLN
INTRAMUSCULAR | Status: AC
  Filled 2013-11-17: qty 1

## 2013-11-17 MED ORDER — SODIUM CHLORIDE 0.9 % IJ SOLN
3.0000 mL | INTRAMUSCULAR | Status: DC | PRN
  Administered 2013-11-18 – 2013-11-19 (×2): 3 mL via INTRAVENOUS

## 2013-11-17 MED ORDER — CEFAZOLIN SODIUM-DEXTROSE 2-3 GM-% IV SOLR
INTRAVENOUS | Status: AC
  Filled 2013-11-17: qty 50

## 2013-11-17 MED ORDER — SENNOSIDES-DOCUSATE SODIUM 8.6-50 MG PO TABS
2.0000 | ORAL_TABLET | ORAL | Status: DC
  Administered 2013-11-17 – 2013-11-18 (×2): 2 via ORAL
  Filled 2013-11-17 (×3): qty 2

## 2013-11-17 MED ORDER — NALBUPHINE HCL 10 MG/ML IJ SOLN: 5.0000 mg | INTRAMUSCULAR | Status: DC | PRN

## 2013-11-17 MED ORDER — LACTATED RINGERS IV SOLN
INTRAVENOUS | Status: DC | PRN
  Administered 2013-11-17 (×2): via INTRAVENOUS

## 2013-11-17 MED ORDER — MIDAZOLAM HCL 2 MG/2ML IJ SOLN
INTRAMUSCULAR | Status: AC
  Filled 2013-11-17: qty 2

## 2013-11-17 MED ORDER — OXYTOCIN 10 UNIT/ML IJ SOLN
INTRAMUSCULAR | Status: AC
  Filled 2013-11-17: qty 4

## 2013-11-17 MED ORDER — KETOROLAC TROMETHAMINE 30 MG/ML IJ SOLN
30.0000 mg | Freq: Four times a day (QID) | INTRAMUSCULAR | Status: AC | PRN
Start: 2013-11-17 — End: 2013-11-18
  Administered 2013-11-17: 30 mg via INTRAVENOUS

## 2013-11-17 MED ORDER — FENTANYL CITRATE 0.05 MG/ML IJ SOLN
INTRAMUSCULAR | Status: DC | PRN
  Administered 2013-11-17: 25 ug via INTRATHECAL
  Administered 2013-11-17: 50 ug via INTRAVENOUS
  Administered 2013-11-17: 75 ug via INTRAVENOUS
  Administered 2013-11-17: 50 ug via INTRAVENOUS

## 2013-11-17 MED ORDER — BUPIVACAINE HCL (PF) 0.25 % IJ SOLN
INTRAMUSCULAR | Status: DC | PRN
  Administered 2013-11-17: 1.4 mL

## 2013-11-17 MED ORDER — PHENYLEPHRINE HCL 10 MG/ML IJ SOLN
INTRAMUSCULAR | Status: DC | PRN
  Administered 2013-11-17: 80 ug via INTRAVENOUS

## 2013-11-17 MED ORDER — LIDOCAINE-EPINEPHRINE (PF) 2 %-1:200000 IJ SOLN
INTRAMUSCULAR | Status: AC
  Filled 2013-11-17: qty 20

## 2013-11-17 MED ORDER — DIPHENHYDRAMINE HCL 25 MG PO CAPS
25.0000 mg | ORAL_CAPSULE | ORAL | Status: DC | PRN
  Administered 2013-11-18: 25 mg via ORAL
  Filled 2013-11-17: qty 1

## 2013-11-17 MED ORDER — KETOROLAC TROMETHAMINE 30 MG/ML IJ SOLN
INTRAMUSCULAR | Status: AC
  Filled 2013-11-17: qty 1

## 2013-11-17 MED ORDER — METOCLOPRAMIDE HCL 5 MG/ML IJ SOLN: 10.0000 mg | Freq: Three times a day (TID) | INTRAMUSCULAR | Status: DC | PRN

## 2013-11-17 MED ORDER — PHENYLEPHRINE 40 MCG/ML (10ML) SYRINGE FOR IV PUSH (FOR BLOOD PRESSURE SUPPORT)
PREFILLED_SYRINGE | INTRAVENOUS | Status: AC
  Filled 2013-11-17: qty 5

## 2013-11-17 MED ORDER — HYDROMORPHONE HCL PF 1 MG/ML IJ SOLN: 0.2500 mg | INTRAMUSCULAR | Status: DC | PRN

## 2013-11-17 MED ORDER — SCOPOLAMINE 1 MG/3DAYS TD PT72
1.0000 | MEDICATED_PATCH | Freq: Once | TRANSDERMAL | Status: DC
  Administered 2013-11-17: 1.5 mg via TRANSDERMAL

## 2013-11-17 MED ORDER — SIMETHICONE 80 MG PO CHEW
80.0000 mg | CHEWABLE_TABLET | ORAL | Status: DC
  Administered 2013-11-17 – 2013-11-19 (×3): 80 mg via ORAL
  Filled 2013-11-17 (×3): qty 1

## 2013-11-17 MED ORDER — MORPHINE SULFATE 0.5 MG/ML IJ SOLN
INTRAMUSCULAR | Status: AC
  Filled 2013-11-17: qty 10

## 2013-11-17 MED ORDER — OXYTOCIN 10 UNIT/ML IJ SOLN
40.0000 [IU] | INTRAVENOUS | Status: DC | PRN
  Administered 2013-11-17: 40 [IU] via INTRAVENOUS

## 2013-11-17 MED ORDER — MENTHOL 3 MG MT LOZG: 1.0000 | LOZENGE | OROMUCOSAL | Status: DC | PRN

## 2013-11-17 MED ORDER — IBUPROFEN 800 MG PO TABS
800.0000 mg | ORAL_TABLET | Freq: Three times a day (TID) | ORAL | Status: DC
  Administered 2013-11-17 – 2013-11-20 (×8): 800 mg via ORAL
  Filled 2013-11-17 (×8): qty 1

## 2013-11-17 MED ORDER — METOCLOPRAMIDE HCL 5 MG/ML IJ SOLN: 10.0000 mg | Freq: Once | INTRAMUSCULAR | Status: DC | PRN

## 2013-11-17 MED ORDER — NALOXONE HCL 0.4 MG/ML IJ SOLN: 0.4000 mg | INTRAMUSCULAR | Status: DC | PRN

## 2013-11-17 MED ORDER — ONDANSETRON HCL 4 MG/2ML IJ SOLN
INTRAMUSCULAR | Status: AC
  Filled 2013-11-17: qty 2

## 2013-11-17 MED ORDER — DIPHENHYDRAMINE HCL 50 MG/ML IJ SOLN: 25.0000 mg | INTRAMUSCULAR | Status: DC | PRN

## 2013-11-17 MED ORDER — LIDOCAINE HCL (PF) 1 % IJ SOLN
INTRAMUSCULAR | Status: DC | PRN
  Administered 2013-11-17 (×2): 5 mL

## 2013-11-17 MED ORDER — TETANUS-DIPHTH-ACELL PERTUSSIS 5-2.5-18.5 LF-MCG/0.5 IM SUSP
0.5000 mL | Freq: Once | INTRAMUSCULAR | Status: DC
Start: 2013-11-18 — End: 2013-11-20

## 2013-11-17 MED ORDER — PHENYLEPHRINE 8 MG IN D5W 100 ML (0.08MG/ML) PREMIX OPTIME
INJECTION | INTRAVENOUS | Status: AC
  Filled 2013-11-17: qty 100

## 2013-11-17 MED ORDER — KETOROLAC TROMETHAMINE 30 MG/ML IJ SOLN: 30.0000 mg | Freq: Four times a day (QID) | INTRAMUSCULAR | Status: AC | PRN

## 2013-11-17 MED ORDER — WITCH HAZEL-GLYCERIN EX PADS: 1.0000 "application " | MEDICATED_PAD | CUTANEOUS | Status: DC | PRN

## 2013-11-17 MED ORDER — SIMETHICONE 80 MG PO CHEW
80.0000 mg | CHEWABLE_TABLET | Freq: Three times a day (TID) | ORAL | Status: DC
  Administered 2013-11-18 – 2013-11-19 (×5): 80 mg via ORAL
  Filled 2013-11-17 (×5): qty 1

## 2013-11-17 MED ORDER — OXYCODONE-ACETAMINOPHEN 5-325 MG PO TABS
1.0000 | ORAL_TABLET | ORAL | Status: DC | PRN
  Administered 2013-11-18 – 2013-11-19 (×3): 2 via ORAL
  Filled 2013-11-17 (×3): qty 2

## 2013-11-17 MED ORDER — ONDANSETRON HCL 4 MG/2ML IJ SOLN
INTRAMUSCULAR | Status: DC | PRN
  Administered 2013-11-17: 4 mg via INTRAVENOUS

## 2013-11-17 MED ORDER — MORPHINE SULFATE (PF) 0.5 MG/ML IJ SOLN
INTRAMUSCULAR | Status: DC | PRN
  Administered 2013-11-17: .15 mg via INTRATHECAL

## 2013-11-17 MED ORDER — NALOXONE HCL 1 MG/ML IJ SOLN: 1.0000 ug/kg/h | INTRAVENOUS | Status: DC | PRN

## 2013-11-17 MED ORDER — MEPERIDINE HCL 25 MG/ML IJ SOLN: 6.2500 mg | INTRAMUSCULAR | Status: DC | PRN

## 2013-11-17 MED ORDER — ONDANSETRON HCL 4 MG/2ML IJ SOLN: 4.0000 mg | Freq: Three times a day (TID) | INTRAMUSCULAR | Status: DC | PRN

## 2013-11-17 MED ORDER — DEXTROSE 5 % IV SOLN
2.0000 g | Freq: Two times a day (BID) | INTRAVENOUS | Status: DC
  Administered 2013-11-17 – 2013-11-19 (×5): 2 g via INTRAVENOUS
  Filled 2013-11-17 (×9): qty 2

## 2013-11-17 MED ORDER — PHENYLEPHRINE 8 MG IN D5W 100 ML (0.08MG/ML) PREMIX OPTIME
INJECTION | INTRAVENOUS | Status: DC | PRN
  Administered 2013-11-17: 60 ug/min via INTRAVENOUS

## 2013-11-17 MED ORDER — PRENATAL MULTIVITAMIN CH
1.0000 | ORAL_TABLET | Freq: Every day | ORAL | Status: DC
  Administered 2013-11-18 – 2013-11-19 (×2): 1 via ORAL
  Filled 2013-11-17 (×2): qty 1

## 2013-11-17 MED ORDER — ONDANSETRON HCL 4 MG/2ML IJ SOLN: 4.0000 mg | INTRAMUSCULAR | Status: DC | PRN

## 2013-11-17 MED ORDER — LANOLIN HYDROUS EX OINT: 1.0000 "application " | TOPICAL_OINTMENT | CUTANEOUS | Status: DC | PRN

## 2013-11-17 MED ORDER — DIPHENHYDRAMINE HCL 50 MG/ML IJ SOLN: 12.5000 mg | INTRAMUSCULAR | Status: DC | PRN

## 2013-11-17 MED ORDER — DIPHENHYDRAMINE HCL 25 MG PO CAPS: 25.0000 mg | ORAL_CAPSULE | Freq: Four times a day (QID) | ORAL | Status: DC | PRN

## 2013-11-17 MED ORDER — CEFAZOLIN SODIUM-DEXTROSE 2-3 GM-% IV SOLR
INTRAVENOUS | Status: DC | PRN
  Administered 2013-11-17: 2 g via INTRAVENOUS

## 2013-11-17 MED ORDER — MIDAZOLAM HCL 2 MG/2ML IJ SOLN
INTRAMUSCULAR | Status: DC | PRN
  Administered 2013-11-17 (×2): 1 mg via INTRAVENOUS

## 2013-11-17 MED ORDER — ZOLPIDEM TARTRATE 5 MG PO TABS: 5.0000 mg | ORAL_TABLET | Freq: Every evening | ORAL | Status: DC | PRN

## 2013-11-17 SURGICAL SUPPLY — 37 items
BENZOIN TINCTURE PRP APPL 2/3 (GAUZE/BANDAGES/DRESSINGS) ×3 IMPLANT
BLADE SURG 10 STRL SS (BLADE) ×6 IMPLANT
CLAMP CORD UMBIL (MISCELLANEOUS) IMPLANT
CLOSURE WOUND 1/2 X4 (GAUZE/BANDAGES/DRESSINGS) ×1
CLOTH BEACON ORANGE TIMEOUT ST (SAFETY) ×3 IMPLANT
CONTAINER PREFILL 10% NBF 15ML (MISCELLANEOUS) IMPLANT
DRAPE LG THREE QUARTER DISP (DRAPES) IMPLANT
DRSG OPSITE POSTOP 4X10 (GAUZE/BANDAGES/DRESSINGS) ×3 IMPLANT
DURAPREP 26ML APPLICATOR (WOUND CARE) ×3 IMPLANT
ELECT REM PT RETURN 9FT ADLT (ELECTROSURGICAL) ×3
ELECTRODE REM PT RTRN 9FT ADLT (ELECTROSURGICAL) ×1 IMPLANT
EXTRACTOR VACUUM M CUP 4 TUBE (SUCTIONS) IMPLANT
EXTRACTOR VACUUM M CUP 4' TUBE (SUCTIONS)
GLOVE BIO SURGEON STRL SZ 6.5 (GLOVE) ×2 IMPLANT
GLOVE BIO SURGEONS STRL SZ 6.5 (GLOVE) ×1
GOWN STRL REUS W/TWL LRG LVL3 (GOWN DISPOSABLE) ×6 IMPLANT
KIT ABG SYR 3ML LUER SLIP (SYRINGE) IMPLANT
NEEDLE HYPO 25X5/8 SAFETYGLIDE (NEEDLE) IMPLANT
NS IRRIG 1000ML POUR BTL (IV SOLUTION) ×3 IMPLANT
PACK C SECTION WH (CUSTOM PROCEDURE TRAY) ×3 IMPLANT
PAD OB MATERNITY 4.3X12.25 (PERSONAL CARE ITEMS) ×3 IMPLANT
RTRCTR C-SECT PINK 25CM LRG (MISCELLANEOUS) ×3 IMPLANT
STAPLER VISISTAT 35W (STAPLE) IMPLANT
STRIP CLOSURE SKIN 1/2X4 (GAUZE/BANDAGES/DRESSINGS) ×2 IMPLANT
SUT MNCRL 0 VIOLET CTX 36 (SUTURE) ×2 IMPLANT
SUT MONOCRYL 0 CTX 36 (SUTURE) ×4
SUT PLAIN 1 NONE 54 (SUTURE) IMPLANT
SUT PLAIN 2 0 XLH (SUTURE) ×3 IMPLANT
SUT VIC AB 0 CT1 27 (SUTURE) ×4
SUT VIC AB 0 CT1 27XBRD ANBCTR (SUTURE) ×2 IMPLANT
SUT VIC AB 2-0 CT1 27 (SUTURE) ×2
SUT VIC AB 2-0 CT1 TAPERPNT 27 (SUTURE) ×1 IMPLANT
SUT VIC AB 4-0 KS 27 (SUTURE) ×3 IMPLANT
SYR BULB IRRIGATION 50ML (SYRINGE) ×3 IMPLANT
TOWEL OR 17X24 6PK STRL BLUE (TOWEL DISPOSABLE) ×3 IMPLANT
TRAY FOLEY CATH 14FR (SET/KITS/TRAYS/PACK) ×3 IMPLANT
WATER STERILE IRR 1000ML POUR (IV SOLUTION) ×3 IMPLANT

## 2013-11-17 NOTE — Progress Notes (Signed)
Over night she had protracted labor, eventually received an epidural and is comfortable Afeb, VSS FHT- Cat I, ctx q 2-3 min VE-8/90/0, vtx Protracted labor, but making progress.  Will continue pitocin and monitor progress.  Discussed possibility that she may need a c-section.

## 2013-11-17 NOTE — Brief Op Note (Signed)
11/16/2013 - 11/17/2013  2:57 PM  PATIENT:  Valerie Chapman Valerie Chapman  23 y.o. female  PRE-OPERATIVE DIAGNOSIS:  ARREST OF DILATION  POST-OPERATIVE DIAGNOSIS:  CESAREAN SECTION FAILURE TO PRGRESS  PROCEDURE:  Procedure(s): CESAREAN SECTION (N/A)  SURGEON:  Surgeon(s) and Role:    * Barabara Motz Bovard-Stuckert, MD - Primary  ANESTHESIA:   epidural and spinal  EBL:  Total I/O In: 2400 [I.V.:2400] Out: 1350 [Urine:550; Blood:800]  BLOOD ADMINISTERED:none  DRAINS: Urinary Catheter (Foley)   SPECIMEN:  Source of Specimen:  Placenta  DISPOSITION OF SPECIMEN:  L&D  COUNTS:  YES  TOURNIQUET:  * No tourniquets in log *  DICTATION: .Other Dictation: Dictation Number [id number]  PLAN OF CARE: Admit to inpatient   PATIENT DISPOSITION:  PACU - hemodynamically stable.   Delay start of Pharmacological VTE agent (>24hrs) due to surgical blood loss or risk of bleeding: not applicable  Br/A+ D/w pt and FOB circumcison for female infant including r/b/a, wish to proceed in office

## 2013-11-17 NOTE — Lactation Note (Signed)
This note was copied from the chart of Valerie Aida Pufferraci Cina. Lactation Consultation Note  Patient Name: Valerie Chapman's Date: 11/17/2013 Reason for consult: Other (Comment) (charting for exclusion)   Maternal Data Formula Feeding for Exclusion: Yes Reason for exclusion: Mother's choice to formula feed on admision  Feeding Feeding Type: Formula  LATCH Score/Interventions                      Lactation Tools Discussed/Used     Consult Status Consult Status: Complete    Lynda RainwaterBryant, Abriana Saltos [place] 11/17/2013, 3:47 PM

## 2013-11-17 NOTE — Op Note (Signed)
NAME:  Valerie Chapman, Valerie Chapman                ACCOUNT NO.:  [id number]  MEDICAL RECORD NO.:  [id number]  LOCATION:  9146                          FACILITY:  [facility]  PHYSICIAN:  Sherron Monday, MD        DATE OF BIRTH:  [birth year]  DATE OF PROCEDURE:  11/17/2013 DATE OF DISCHARGE:                              OPERATIVE REPORT   PREOPERATIVE DIAGNOSIS:  Intrauterine pregnancy at term with rupture of membranes, arrest of dilatation.  POSTOPERATIVE DIAGNOSIS:  Intrauterine pregnancy at term with rupture of membranes, arrest of dilatation, delivered.  PROCEDURE:  Primary low transverse cesarean section.  SURGEON:  Sherron Monday, MD  EBL:  Approximately 800 mL.  URINE OUTPUT:  550 mL clear urine at the end of the procedure.  IV FLUIDS:  2400 mL.  COMPLICATION:  There was some difficulty with patient comfort during the procedure.  Initially, her epidural had been dosed, this did not help, a low-dose spinal was placed.  There was some question with this.  She was given ketamine.  PATHOLOGY:  Placenta to L and D.  FINDINGS:  Viable female infant at 79 with Apgars pending and weight pending at the time of dictation.  Normal postpartum uterus, tubes, and ovaries.  DESCRIPTION OF PROCEDURE:  After informed consent was reviewed with the patient including risks, benefits, and alternatives of surgical procedure, she was transported to the OR, placed on the table in supine position with a leftward tilt.  Epidural was unable to be dosed.  So the draping was taken off and the patient's spinal was placed.  She was re- prepped and draped.  A Pfannenstiel skin incision was made at the level of 2 fingerbreadths above the pubic symphysis carried through the underlying layer of fascia sharply.  The fascia was incised in the midline.  This incision was extended with Mayo scissors.  The superior aspect of the fascial incision was grasped with Kocher clamps, elevating the rectus muscles, were dissected off both  bluntly and sharply.  The peritoneum was entered bluntly.  This incision was extended superiorly and inferiorly with good visualization of the bladder.  The Alexis skin retractor was placed carefully making sure no bowel was entrapped. Using a wet sponge, bowel was packed.  The uterus was incised in a transverse fashion.  Infant was delivered from the vertex presentation with the aid of a vacuum.  Nose and mouth were suctioned on the field. Cord was clamped and cut.  Infant was handed off to the awaiting pediatric staff.  Cord gases were collected and are pending at the time of dictation.  The placenta was expressed from the uterus.  The uterus was cleared of all clot and debris.  Uterine incision was closed with 2 layers of 0 Vicryl, Monocryl the first of which was running locked and the second as an imbricating layer.  The incision was noted to be hemostatic.  The gutters were cleared of all clot and debris as well. The peritoneum was reapproximated using 2-0 Vicryl in a running fashion. Subfascial planes were inspected, found to be hemostatic.  Fascia was then closed with a single suture of 0 Vicryl.  The adipose layer was made hemostatic  with Bovie cautery.  The dead space was closed with 3-0 plain gut.  The skin was closed with 4-0 Vicryl on a Keith needle in a subcuticular fashion.  Benzoin and Steri-Strips were applied.  Sponge, lap, and needle counts were correct x2 per the operating room staff. The patient tolerated the procedure and was taken to the PACU in stable condition.     Sherron MondayJody Bovard, MD     JB/MEDQ  D:  11/17/2013  T:  11/17/2013  Job:  [id number]

## 2013-11-17 NOTE — Progress Notes (Signed)
Patient ID: Valerie Chapman, female   DOB: 1990/07/13, 23 y.o.   MRN: [id number]  Pt with slow/no cervical change overnight.  Unchanged since this AM, pt frustrated  D/w pt labor and process as well as LTCS including r/b/a pt wishes to proceed.  SVE 9/90 - swollen/+1

## 2013-11-17 NOTE — Progress Notes (Signed)
Having pain in her back Afeb, VSS FHT- Cat II, occasional variable VE-9/90/0, vtx Making slow progress, discussed that I am not sure she will deliver vaginally, but since baby looks good and she has made change that we should give it a little more time.

## 2013-11-17 NOTE — Progress Notes (Signed)
Patient ID: Valerie Chapman, female   DOB: June 18, 1990, 23 y.o.   MRN: [id number]  RN called with Temp = 101.6 Pt ruptured >24hr before delivery.  Will cover with Cefotan

## 2013-11-17 NOTE — Anesthesia Procedure Notes (Addendum)
Epidural Patient location during procedure: OB Start time: 11/17/2013 12:07 AM  Staffing Anesthesiologist: Brayton CavesJACKSON, FREEMAN Performed by: anesthesiologist   Preanesthetic Checklist Completed: patient identified, site marked, surgical consent, pre-op evaluation, timeout performed, IV checked, risks and benefits discussed and monitors and equipment checked  Epidural Patient position: sitting Prep: site prepped and draped and DuraPrep Patient monitoring: continuous pulse ox and blood pressure Approach: midline Location: L3-L4 Injection technique: LOR air  Needle:  Needle type: Tuohy  Needle gauge: 17 G Needle length: 9 cm and 9 Needle insertion depth: 5 cm cm Catheter type: closed end flexible Catheter size: 19 Gauge Catheter at skin depth: 10 cm Test dose: negative  Assessment Events: blood not aspirated, injection not painful, no injection resistance, negative IV test and no paresthesia  Additional Notes Patient identified.  Risk benefits discussed including failed block, incomplete pain control, headache, nerve damage, paralysis, blood pressure changes, nausea, vomiting, reactions to medication both toxic or allergic, and postpartum back pain.  Patient expressed understanding and wished to proceed.  All questions were answered.  Sterile technique used throughout procedure and epidural site dressed with sterile barrier dressing. No paresthesia or other complications noted.The patient did not experience any signs of intravascular injection such as tinnitus or metallic taste in mouth nor signs of intrathecal spread such as rapid motor block. Please see nursing notes for vital signs.   Spinal  Patient location during procedure: OR Start time: 11/17/2013 1:55 PM Staffing Anesthesiologist: Eri Mcevers A. Preanesthetic Checklist Completed: patient identified, site marked, surgical consent, pre-op evaluation, timeout performed, IV checked, risks and benefits discussed and  monitors and equipment checked Spinal Block Patient position: sitting Prep: site prepped and draped and DuraPrep Patient monitoring: heart rate, cardiac monitor, continuous pulse ox and blood pressure Approach: midline Location: L3-4 Injection technique: single-shot Needle Needle type: Sprotte  Needle gauge: 24 G Needle length: 9 cm Needle insertion depth: 6 cm Assessment Sensory level: T4 Additional Notes Patient tolerated procedure well. She still has sensation to pinch at T12.

## 2013-11-17 NOTE — Transfer of Care (Signed)
Immediate Anesthesia Transfer of Care Note  Patient: Valerie Chapman  Procedure(s) Performed: Procedure(s): CESAREAN SECTION (N/A)  Patient Location: PACU  Anesthesia Type:Spinal  Level of Consciousness: sedated  Airway & Oxygen Therapy: Patient Spontanous Breathing  Post-op Assessment: Report given to PACU RN  Post vital signs: Reviewed and stable  Complications: No apparent anesthesia complications

## 2013-11-17 NOTE — Anesthesia Postprocedure Evaluation (Signed)
  Anesthesia Post-op Note  Patient: Valerie Chapman  Procedure(s) Performed: Procedure(s): CESAREAN SECTION (N/A)  Patient is awake, responsive, moving her legs, and has signs of resolution of her numbness. Pain and nausea are reasonably well controlled. Vital signs are stable and clinically acceptable. Oxygen saturation is clinically acceptable. There are no apparent anesthetic complications at this time. Patient is ready for discharge.

## 2013-11-18 LAB — CBC
HEMATOCRIT: 31.5 % — AB (ref 36.0–46.0)
Hemoglobin: 10.6 g/dL — ABNORMAL LOW (ref 12.0–15.0)
MCH: 30.4 pg (ref 26.0–34.0)
MCHC: 33.7 g/dL (ref 30.0–36.0)
MCV: 90.3 fL (ref 78.0–100.0)
PLATELETS: 168 10*3/uL (ref 150–400)
RBC: 3.49 MIL/uL — AB (ref 3.87–5.11)
RDW: 14 % (ref 11.5–15.5)
WBC: 17.3 10*3/uL — AB (ref 4.0–10.5)

## 2013-11-18 LAB — BIRTH TISSUE RECOVERY COLLECTION (PLACENTA DONATION)

## 2013-11-18 MED ORDER — PNEUMOCOCCAL VAC POLYVALENT 25 MCG/0.5ML IJ INJ
0.5000 mL | INJECTION | INTRAMUSCULAR | Status: AC
  Administered 2013-11-20: 0.5 mL via INTRAMUSCULAR
  Filled 2013-11-18: qty 0.5

## 2013-11-18 MED ORDER — MEASLES, MUMPS & RUBELLA VAC ~~LOC~~ INJ
0.5000 mL | INJECTION | Freq: Once | SUBCUTANEOUS | Status: AC
  Administered 2013-11-20: 0.5 mL via SUBCUTANEOUS
  Filled 2013-11-18 (×2): qty 0.5

## 2013-11-18 NOTE — Progress Notes (Signed)
Subjective: Postpartum Day 1: Cesarean Delivery Patient reports incisional pain and tolerating PO.  Nl lochia, pain controlled.  Febrile overnight, after delivery - given Cefotan 2 g q 2 hr  Objective: Vital signs in last 24 hours: Temp:  [98 F (36.7 C)-101.6 F (38.7 C)] 98.8 F (37.1 C) (08/21 0552) Pulse Rate:  [70-99] 82 (08/21 0552) Resp:  [16-20] 16 (08/21 0552) BP: (82-154)/(57-113) 120/74 mmHg (08/21 0552) SpO2:  [93 %-98 %] 94 % (08/21 0552)  Physical Exam:  General: alert and no distress Lochia: appropriate Uterine Fundus: firm, mildly tender Incision: healing well DVT Evaluation: No evidence of DVT seen on physical exam.   Recent Labs  11/16/13 0430 11/18/13 0535  HGB 12.5 10.6*  HCT 36.1 31.5*    Assessment/Plan: Status post Cesarean section. Doing well postoperatively.  Continue current care.  Monitor fundal tenderness and temp  Bovard-Stuckert, Valerie Chapman 11/18/2013, 6:40 AM

## 2013-11-18 NOTE — Anesthesia Postprocedure Evaluation (Signed)
  Anesthesia Post-op Note  Patient: Valerie Chapman  Procedure(s) Performed: Procedure(s): CESAREAN SECTION (N/A)  Patient Location: Mother/Baby  Anesthesia Type:Epidural  Level of Consciousness: awake  Airway and Oxygen Therapy: Patient Spontanous Breathing  Post-op Pain: mild  Post-op Assessment: Patient's Cardiovascular Status Stable and Respiratory Function Stable  Post-op Vital Signs: stable  Last Vitals:  Filed Vitals:   11/18/13 0552  BP: 120/74  Pulse: 82  Temp: 37.1 C  Resp: 16    Complications: No apparent anesthesia complications

## 2013-11-18 NOTE — Progress Notes (Signed)
Ur chart reviewed completed.

## 2013-11-18 NOTE — Addendum Note (Signed)
Addendum created 11/18/13 0758 by Renford DillsJanet L Belia Febo, CRNA   Modules edited: Notes Section   Notes Section:  File: [id number]

## 2013-11-19 ENCOUNTER — Encounter (HOSPITAL_COMMUNITY): Payer: Self-pay | Admitting: Obstetrics and Gynecology

## 2013-11-19 NOTE — Progress Notes (Signed)
Patient ID: Valerie Chapman, female   DOB: Jul 01, 1990, 23 y.o.   MRN: [id number] #[id number] afebrile BP normal On cefotan She is tolerating a regular diet, passing flatus, voiding and ambulating well

## 2013-11-20 MED ORDER — IBUPROFEN 800 MG PO TABS: 800.0000 mg | ORAL_TABLET | Freq: Three times a day (TID) | ORAL | Status: DC | PRN

## 2013-11-20 MED ORDER — OXYCODONE-ACETAMINOPHEN 5-325 MG PO TABS: 1.0000 | ORAL_TABLET | Freq: Three times a day (TID) | ORAL | Status: DC | PRN

## 2013-11-20 NOTE — Progress Notes (Signed)
Patient ID: Valerie Chapman, female   DOB: [birth year], 23 y.o.   MRN: [id number] #[id number] afebrile BP normal for d/c

## 2013-11-20 NOTE — Discharge Instructions (Signed)
booklet °

## 2013-11-20 NOTE — Discharge Summary (Signed)
NAME:  Topete, Edrie                ACLAKETTA, SODERBERG  [id number]  MEDICAL RECORD NO.:  [id number]  LOCATION:  9146                          FACILITY:  [facility]  PHYSICIAN:  Malachi Pro. Ambrose Mantle, M.D. DATE OF BIRTH:  27-Mar-1991  DATE OF ADMISSION:  11/16/2013 DATE OF DISCHARGE:  11/19/2013                              DISCHARGE SUMMARY   HISTORY OF PRESENT ILLNESS:  This is a 23 year old white female, para 0- 0-1-0, gravida 2 at 38 weeks and 6 days  presented with ruptured membranes.  After admission to the hospital, the patient was started on Pitocin.  By the following morning, she had made slow progress which had made change. By 12:56 p.m. on November 17, 2013, cervix was 9 cm, 90%, vertex was thought to be at +1.  The patient made no further progress and she was taken to the OR by Dr. Ellyn Hack and underwent a low-transverse cervical C-section with delivery of a healthy infant.  There was some difficulty with the patient's comfort during the procedure.  Initially, her epidural had been dosed, this did not help, a low-dose spinal was placed, there was some question with this, she was then given ketamine. The patient's infant was delivered by C-section with the aid of a vacuum.  Nose and pharynx were suctioned on the field.  Cord blood gases were collected.  Postoperatively, the patient did quite well.  She did have a fever over 101 degrees and was started on Cefotetan and she remained on Cefotetan for 2 days.  The temperature elevation reached 101.6 degrees, so the Cefotetan was continued and is now discontinued at the time of discharge.  At the present time, she is ambulating well, tolerating a regular diet,  passing flatus, voiding, and is ready for discharge.  LABORATORY DATA:  Initial hemoglobin of 12.5, hematocrit 36.1, white count 13,800, platelet count 244,000.  RPR was nonreactive.  Final hemoglobin was 10.6.  FINAL DIAGNOSES:  Intrauterine pregnancy at 39 weeks, delivered vertex with the  aid of a vacuum by C-section, prolonged labor, arrest of dilatation.  OPERATION:  Low-transverse cervical C-section.  Vacuum assisted delivery.  FINAL CONDITION:  Improved.  INSTRUCTIONS:  Include our regular discharge instruction booklet as well as the after visit summary.  Prescriptions for Motrin 800 mg 30 tablets, 1 every 8 hours as needed for pain and Percocet 5/325, 30 tablets, 1-2 tablets by mouth every 8 hours as needed for pain.  The patient is advised to return to the office in 2 weeks for followup examination.     Malachi Pro. Ambrose Mantle, M.D.     TFH/MEDQ  D:  11/20/2013  T:  11/20/2013  Job:  [id number]

## 2014-01-30 ENCOUNTER — Encounter (HOSPITAL_COMMUNITY): Payer: Self-pay | Admitting: Obstetrics and Gynecology

## 2014-12-31 IMAGING — US US THYROID BIOPSY
1 series · 9 of 9 positions shown · non-contrast
Comparison: 06/10/2013

CLINICAL DATA: Right thyroid complex cyst.

EXAM:
ULTRASOUND GUIDED NEEDLE ASPIRATE BIOPSY OF THE THYROID GLAND

[Series 1: us thyroid biopsy · 0.07mm/px · 9 acquisitions, 9 frames shown]
[im 1/9]
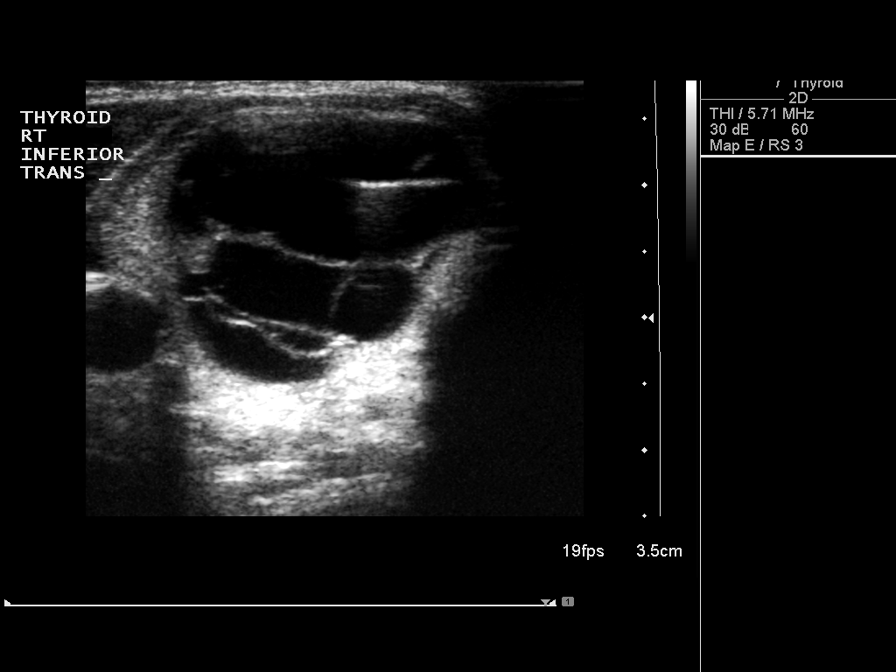
[im 2/9]
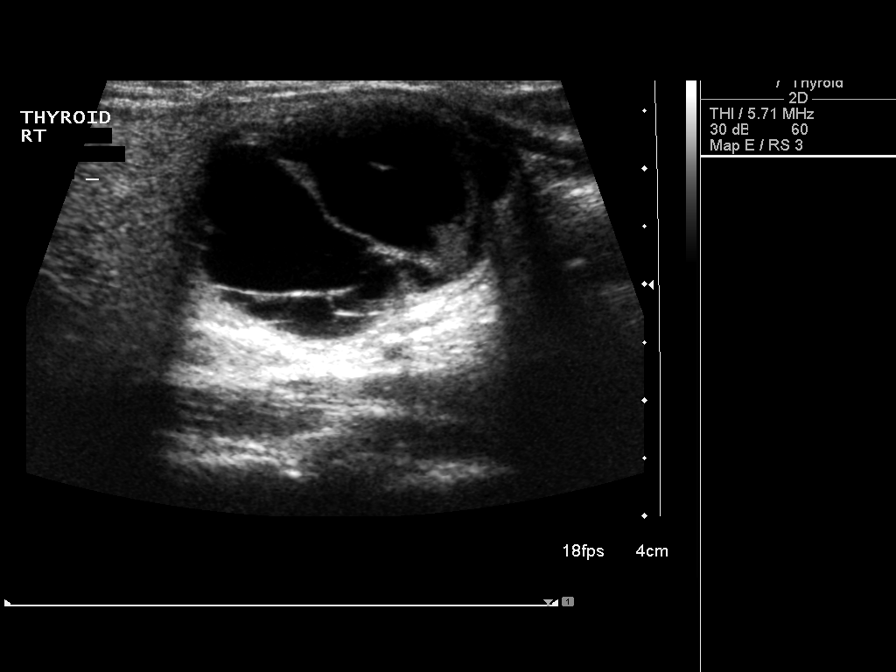
[im 3/9]
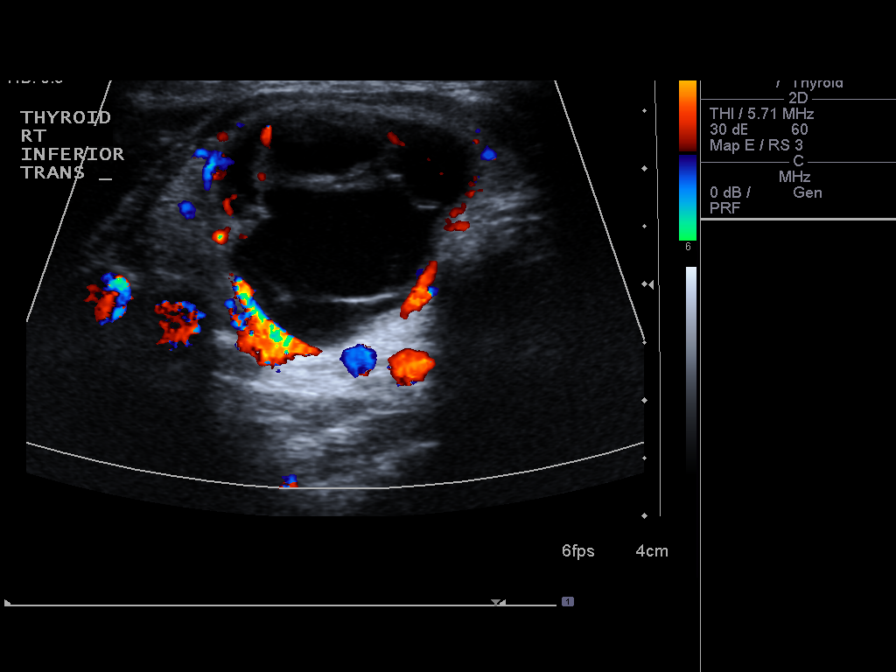
[im 4/9]
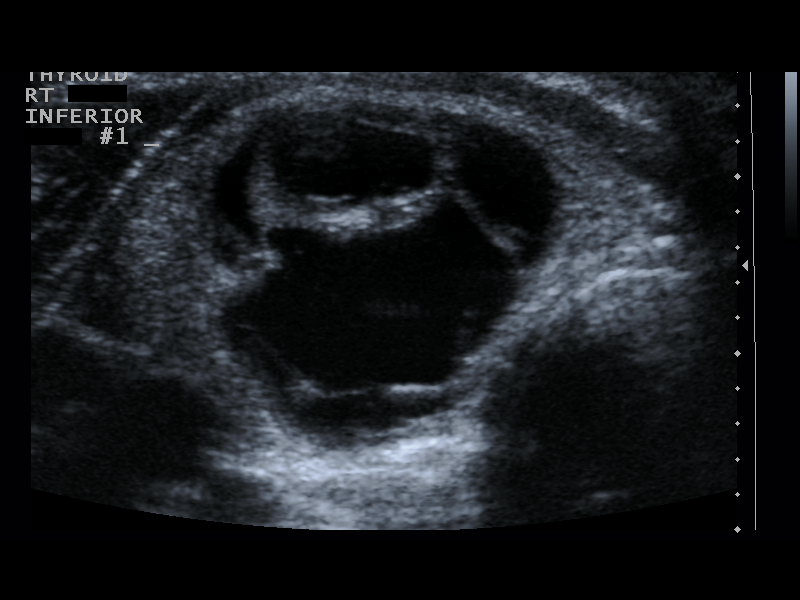
[im 5/9]
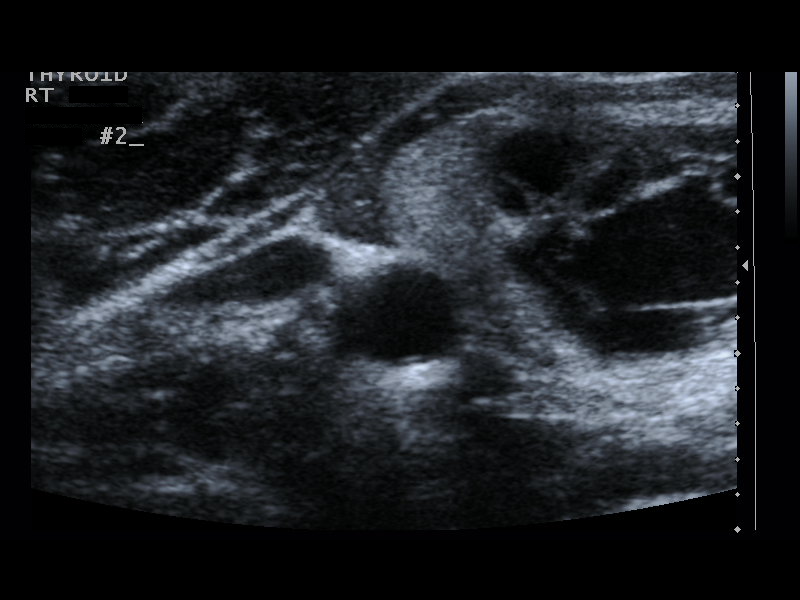
[im 6/9]
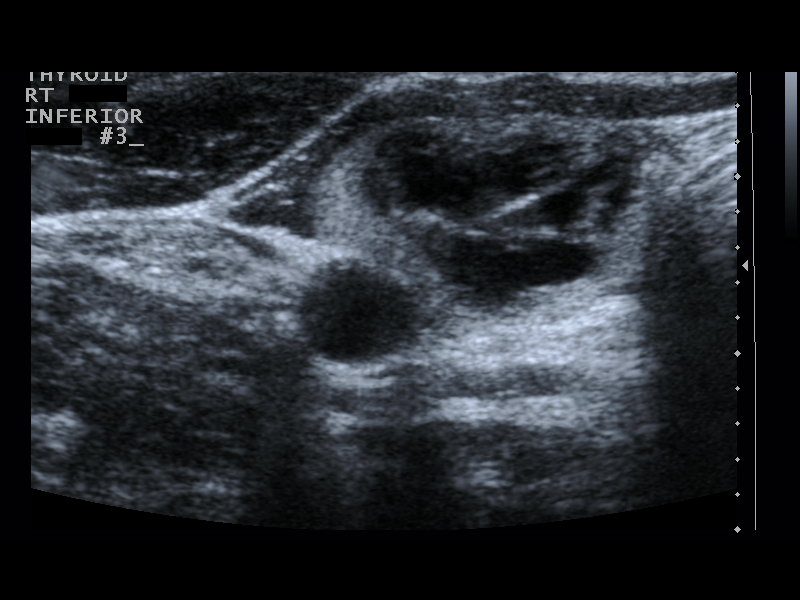
[im 7/9]
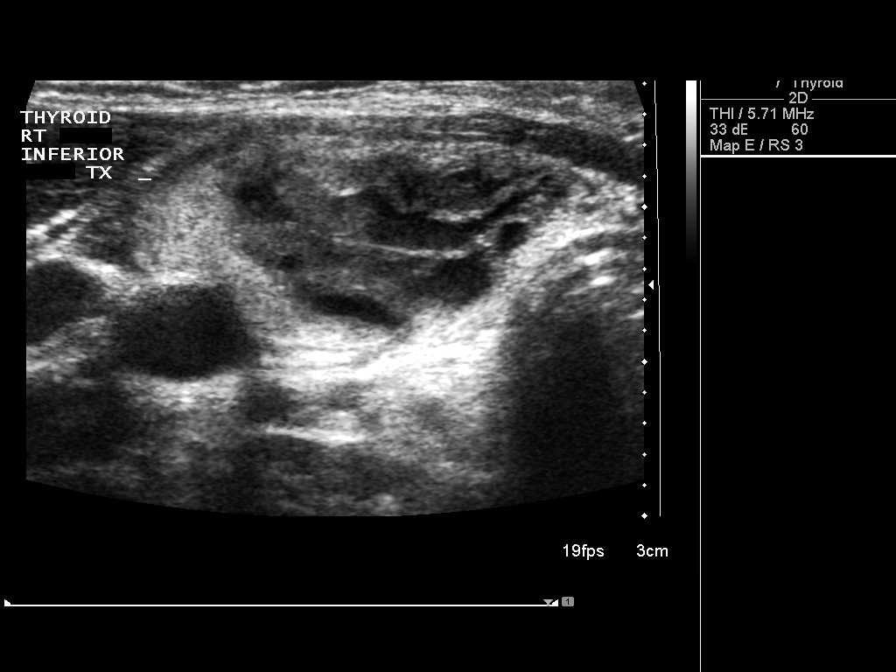
[im 8/9]
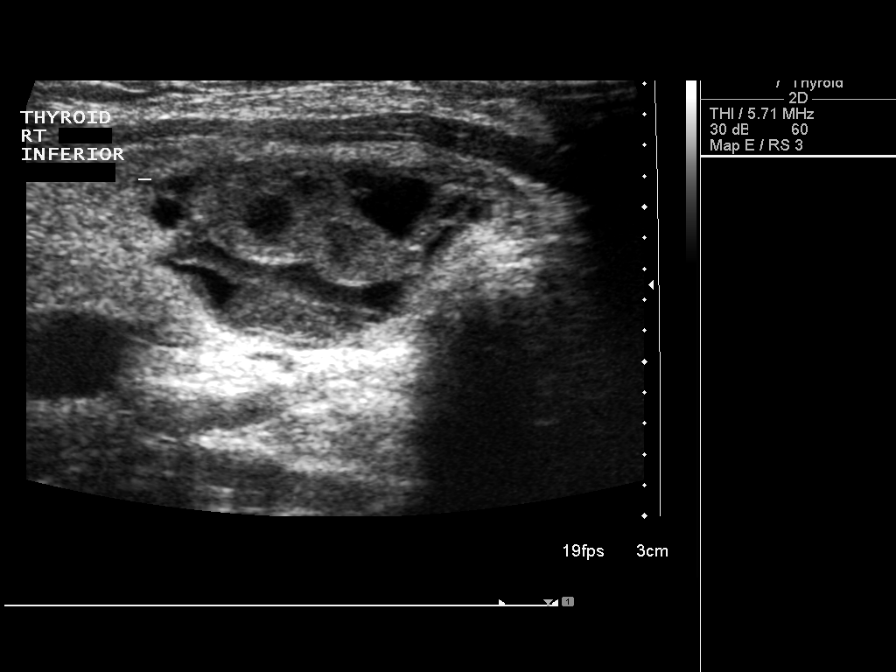
[im 9/9]
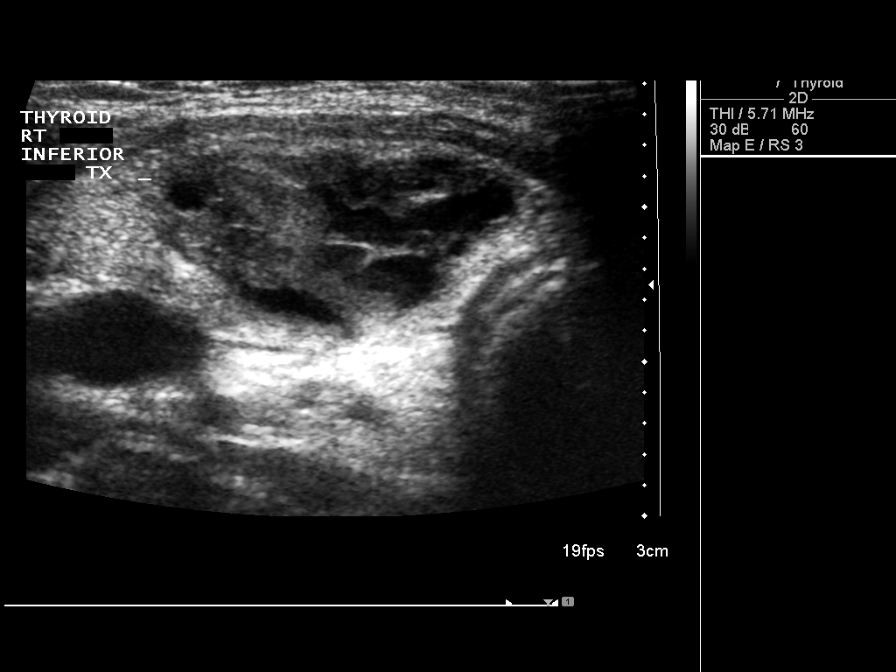

[9 of 9 positions shown; findings below may reference images not displayed]

PROCEDURE:
Thyroid biopsy was thoroughly discussed with the patient and
questions were answered. The benefits, risks, alternatives, and
complications were also discussed. The patient understands and
wishes to proceed with the procedure. Written consent was obtained.

Ultrasound was performed to localize and mark an adequate site for
the biopsy. The patient was then prepped and draped in a normal
sterile fashion. Local anesthesia was provided with 1% lidocaine.
Using direct ultrasound guidance, 3 passes were made using needles
into the nodule within the right lobe of the thyroid. Ultrasound was
used to confirm needle placements on all occasions. Specimens were
sent to Pathology for analysis.

Complications:  None
FINDINGS: There is a dominant complex cystic structure in the right thyroid
lobe. There is no significant solid component or internal
vascularity. This cyst was decompressed following the aspirations.
Approximately 2 cc of yellow and dark brown fluid was removed.
IMPRESSION: Ultrasound guided needle aspirate biopsy performed of the right
thyroid complex cyst.

## 2017-11-20 ENCOUNTER — Other Ambulatory Visit (HOSPITAL_COMMUNITY)
Admission: RE | Admit: 2017-11-20 | Discharge: 2017-11-20 | Disposition: A | Discharge status: Home / Self Care | Payer: Managed Care, Other (non HMO) | Source: Ambulatory Visit | Attending: Family Medicine | Admitting: Family Medicine

## 2017-11-20 ENCOUNTER — Other Ambulatory Visit: Payer: Self-pay | Admitting: Family Medicine

## 2017-11-20 DIAGNOSIS — Z124 Encounter for screening for malignant neoplasm of cervix: Secondary | ICD-10-CM | POA: Insufficient documentation

## 2017-11-23 LAB — CYTOLOGY - PAP
CHLAMYDIA, DNA PROBE: NEGATIVE
Diagnosis: NEGATIVE
HPV: NOT DETECTED
Neisseria Gonorrhea: NEGATIVE

## 2022-02-05 LAB — OB RESULTS CONSOLE GC/CHLAMYDIA
Chlamydia: NEGATIVE
Neisseria Gonorrhea: NEGATIVE

## 2022-02-05 LAB — HEPATITIS C ANTIBODY: HCV Ab: NEGATIVE

## 2022-02-05 LAB — OB RESULTS CONSOLE HEPATITIS B SURFACE ANTIGEN: Hepatitis B Surface Ag: NEGATIVE

## 2022-02-05 LAB — OB RESULTS CONSOLE HIV ANTIBODY (ROUTINE TESTING): HIV: NONREACTIVE

## 2022-02-05 LAB — OB RESULTS CONSOLE RUBELLA ANTIBODY, IGM: Rubella: IMMUNE

## 2022-02-07 LAB — OB RESULTS CONSOLE RPR: RPR: NONREACTIVE

## 2022-02-09 ENCOUNTER — Other Ambulatory Visit: Payer: Self-pay

## 2022-02-16 ENCOUNTER — Other Ambulatory Visit: Payer: Self-pay

## 2022-04-20 ENCOUNTER — Other Ambulatory Visit: Payer: Self-pay

## 2022-07-02 ENCOUNTER — Encounter: Payer: Self-pay | Admitting: *Deleted

## 2022-07-10 ENCOUNTER — Other Ambulatory Visit: Payer: Self-pay | Admitting: Obstetrics and Gynecology

## 2022-07-10 ENCOUNTER — Ambulatory Visit: Payer: Medicaid Other | Attending: Obstetrics and Gynecology

## 2022-07-10 ENCOUNTER — Ambulatory Visit: Payer: Medicaid Other | Admitting: *Deleted

## 2022-07-10 ENCOUNTER — Encounter: Payer: Self-pay | Admitting: *Deleted

## 2022-07-10 VITALS — BP 134/82 | HR 84

## 2022-07-10 DIAGNOSIS — O3660X0 Maternal care for excessive fetal growth, unspecified trimester, not applicable or unspecified: Secondary | ICD-10-CM

## 2022-07-10 DIAGNOSIS — Z3689 Encounter for other specified antenatal screening: Secondary | ICD-10-CM | POA: Diagnosis present

## 2022-07-10 DIAGNOSIS — Z363 Encounter for antenatal screening for malformations: Secondary | ICD-10-CM | POA: Diagnosis present

## 2022-07-10 DIAGNOSIS — Z3A34 34 weeks gestation of pregnancy: Secondary | ICD-10-CM | POA: Insufficient documentation

## 2022-07-22 LAB — OB RESULTS CONSOLE GBS: GBS: NEGATIVE

## 2022-07-29 ENCOUNTER — Encounter (HOSPITAL_COMMUNITY): Payer: Self-pay | Admitting: Obstetrics & Gynecology

## 2022-07-29 ENCOUNTER — Other Ambulatory Visit: Payer: Self-pay | Admitting: Obstetrics & Gynecology

## 2022-07-29 ENCOUNTER — Inpatient Hospital Stay (HOSPITAL_COMMUNITY)
Admission: AD | Admit: 2022-07-29 | Discharge: 2022-08-04 | DRG: 788 | Disposition: A | Discharge status: Home / Self Care | Payer: Medicaid Other | Attending: Obstetrics and Gynecology | Admitting: Obstetrics and Gynecology

## 2022-07-29 ENCOUNTER — Other Ambulatory Visit: Payer: Self-pay

## 2022-07-29 DIAGNOSIS — O1414 Severe pre-eclampsia complicating childbirth: Secondary | ICD-10-CM | POA: Diagnosis present

## 2022-07-29 DIAGNOSIS — O34211 Maternal care for low transverse scar from previous cesarean delivery: Secondary | ICD-10-CM | POA: Diagnosis present

## 2022-07-29 DIAGNOSIS — Z23 Encounter for immunization: Secondary | ICD-10-CM | POA: Diagnosis not present

## 2022-07-29 DIAGNOSIS — Z3A37 37 weeks gestation of pregnancy: Secondary | ICD-10-CM | POA: Diagnosis not present

## 2022-07-29 DIAGNOSIS — O1413 Severe pre-eclampsia, third trimester: Principal | ICD-10-CM

## 2022-07-29 DIAGNOSIS — O9902 Anemia complicating childbirth: Secondary | ICD-10-CM | POA: Diagnosis not present

## 2022-07-29 DIAGNOSIS — Z98891 History of uterine scar from previous surgery: Secondary | ICD-10-CM

## 2022-07-29 DIAGNOSIS — D649 Anemia, unspecified: Secondary | ICD-10-CM | POA: Diagnosis not present

## 2022-07-29 DIAGNOSIS — Z87891 Personal history of nicotine dependence: Secondary | ICD-10-CM | POA: Diagnosis not present

## 2022-07-29 DIAGNOSIS — Z141 Cystic fibrosis carrier: Secondary | ICD-10-CM | POA: Diagnosis not present

## 2022-07-29 DIAGNOSIS — O1410 Severe pre-eclampsia, unspecified trimester: Secondary | ICD-10-CM | POA: Diagnosis present

## 2022-07-29 LAB — CBC
HCT: 35.3 % — ABNORMAL LOW (ref 36.0–46.0)
Hemoglobin: 11.8 g/dL — ABNORMAL LOW (ref 12.0–15.0)
MCH: 30.3 pg (ref 26.0–34.0)
MCHC: 33.4 g/dL (ref 30.0–36.0)
MCV: 90.5 fL (ref 80.0–100.0)
Platelets: 196 10*3/uL (ref 150–400)
RBC: 3.9 MIL/uL (ref 3.87–5.11)
RDW: 13.6 % (ref 11.5–15.5)
WBC: 11.6 10*3/uL — ABNORMAL HIGH (ref 4.0–10.5)
nRBC: 0 % (ref 0.0–0.2)

## 2022-07-29 LAB — COMPREHENSIVE METABOLIC PANEL
ALT: 11 U/L (ref 0–44)
AST: 12 U/L — ABNORMAL LOW (ref 15–41)
Albumin: 2.3 g/dL — ABNORMAL LOW (ref 3.5–5.0)
Alkaline Phosphatase: 148 U/L — ABNORMAL HIGH (ref 38–126)
Anion gap: 11 (ref 5–15)
BUN: 5 mg/dL — ABNORMAL LOW (ref 6–20)
CO2: 19 mmol/L — ABNORMAL LOW (ref 22–32)
Calcium: 8.5 mg/dL — ABNORMAL LOW (ref 8.9–10.3)
Chloride: 104 mmol/L (ref 98–111)
Creatinine, Ser: 0.57 mg/dL (ref 0.44–1.00)
GFR, Estimated: >60 mL/min (ref >60)
Glucose, Bld: 73 mg/dL (ref 70–99)
Potassium: 3.5 mmol/L (ref 3.5–5.1)
Sodium: 134 mmol/L — ABNORMAL LOW (ref 135–145)
Total Bilirubin: 0.5 mg/dL (ref 0.3–1.2)
Total Protein: 5.6 g/dL — ABNORMAL LOW (ref 6.5–8.1)

## 2022-07-29 LAB — URINALYSIS, ROUTINE W REFLEX MICROSCOPIC
Bilirubin Urine: NEGATIVE
Glucose, UA: NEGATIVE mg/dL
Ketones, ur: NEGATIVE mg/dL
Nitrite: NEGATIVE
Protein, ur: NEGATIVE mg/dL
Specific Gravity, Urine: 1.004 — ABNORMAL LOW (ref 1.005–1.030)
pH: 6 (ref 5.0–8.0)

## 2022-07-29 LAB — PROTEIN / CREATININE RATIO, URINE
Creatinine, Urine: 35 mg/dL
Total Protein, Urine: <6 mg/dL

## 2022-07-29 LAB — TYPE AND SCREEN
ABO/RH(D): A POS
Antibody Screen: NEGATIVE

## 2022-07-29 MED ORDER — HYDRALAZINE HCL 20 MG/ML IJ SOLN
10.0000 mg | INTRAMUSCULAR | Status: DC | PRN
  Administered 2022-08-03 (×2): 10 mg via INTRAVENOUS
  Filled 2022-07-29 (×2): qty 1

## 2022-07-29 MED ORDER — OXYTOCIN-SODIUM CHLORIDE 30-0.9 UT/500ML-% IV SOLN: 2.5000 [IU]/h | INTRAVENOUS | Status: DC

## 2022-07-29 MED ORDER — LACTATED RINGERS IV SOLN: INTRAVENOUS | Status: DC

## 2022-07-29 MED ORDER — OXYTOCIN BOLUS FROM INFUSION: 333.0000 mL | Freq: Once | INTRAVENOUS | Status: DC

## 2022-07-29 MED ORDER — TERBUTALINE SULFATE 1 MG/ML IJ SOLN: 0.2500 mg | Freq: Once | INTRAMUSCULAR | Status: DC | PRN

## 2022-07-29 MED ORDER — LACTATED RINGERS IV SOLN: 500.0000 mL | INTRAVENOUS | Status: DC | PRN

## 2022-07-29 MED ORDER — SOD CITRATE-CITRIC ACID 500-334 MG/5ML PO SOLN
30.0000 mL | ORAL | Status: DC | PRN
  Administered 2022-07-31: 30 mL via ORAL
  Filled 2022-07-29: qty 30

## 2022-07-29 MED ORDER — ACETAMINOPHEN 325 MG PO TABS
650.0000 mg | ORAL_TABLET | ORAL | Status: DC | PRN
  Administered 2022-07-29 – 2022-07-30 (×2): 650 mg via ORAL
  Filled 2022-07-29 (×2): qty 2

## 2022-07-29 MED ORDER — LABETALOL HCL 5 MG/ML IV SOLN
40.0000 mg | INTRAVENOUS | Status: DC | PRN
  Administered 2022-07-30: 40 mg via INTRAVENOUS
  Filled 2022-07-29: qty 8

## 2022-07-29 MED ORDER — ONDANSETRON HCL 4 MG/2ML IJ SOLN
4.0000 mg | Freq: Four times a day (QID) | INTRAMUSCULAR | Status: DC | PRN
  Administered 2022-07-30: 4 mg via INTRAVENOUS
  Filled 2022-07-29: qty 2

## 2022-07-29 MED ORDER — OXYTOCIN-SODIUM CHLORIDE 30-0.9 UT/500ML-% IV SOLN
1.0000 m[IU]/min | INTRAVENOUS | Status: DC
  Administered 2022-07-29: 1 m[IU]/min via INTRAVENOUS
  Filled 2022-07-29: qty 500

## 2022-07-29 MED ORDER — MAGNESIUM SULFATE BOLUS VIA INFUSION
4.0000 g | Freq: Once | INTRAVENOUS | Status: AC
  Administered 2022-07-29: 4 g via INTRAVENOUS
  Filled 2022-07-29: qty 1000

## 2022-07-29 MED ORDER — LABETALOL HCL 5 MG/ML IV SOLN
20.0000 mg | INTRAVENOUS | Status: DC | PRN
  Administered 2022-07-30 – 2022-08-02 (×3): 20 mg via INTRAVENOUS
  Filled 2022-07-29 (×4): qty 4

## 2022-07-29 MED ORDER — LIDOCAINE HCL (PF) 1 % IJ SOLN: 30.0000 mL | INTRAMUSCULAR | Status: DC | PRN

## 2022-07-29 MED ORDER — BUTALBITAL-APAP-CAFFEINE 50-325-40 MG PO TABS
2.0000 | ORAL_TABLET | Freq: Once | ORAL | Status: AC
  Administered 2022-07-29: 2 via ORAL
  Filled 2022-07-29: qty 2

## 2022-07-29 MED ORDER — FENTANYL CITRATE (PF) 100 MCG/2ML IJ SOLN: 100.0000 ug | INTRAMUSCULAR | Status: DC | PRN

## 2022-07-29 MED ORDER — MAGNESIUM SULFATE 40 GM/1000ML IV SOLN
2.0000 g/h | INTRAVENOUS | Status: AC
  Administered 2022-07-29 – 2022-08-01 (×4): 2 g/h via INTRAVENOUS
  Filled 2022-07-29 (×4): qty 1000

## 2022-07-29 MED ORDER — LABETALOL HCL 5 MG/ML IV SOLN: 80.0000 mg | INTRAVENOUS | Status: DC | PRN

## 2022-07-29 NOTE — MAU Note (Signed)
G3P1 at 37.1 sent from office with elevated Bps in 140/90's range.  Reports HA currently, no RUQ pain, +2edema, no clonus, +2 reflexes.    Reports good FM, no LOF or VB.    No current or past OB concerns reported.

## 2022-07-29 NOTE — MAU Provider Note (Signed)
History     CSN: [id number]  Arrival date and time: 07/29/22 1626   None     Chief Complaint  Patient presents with   BP Evaluation   HPI  [name] is a 32 y.o. female G3P1011 @ [redacted]w[redacted]d here with headache, mild right vision changes, and elevated BP. She reports new onset headache last night; she has tried tylenol x 2 doses. The tylenol helps very minimally and then the HA comes back and is worse. She was seen in the office today and BP's were elevated. The last time she took tylenol was 12 noon. The HA initially went away and then shortly returned. Reports pain behind her right eye. No RUQ pain, no edema.  + fetal movement.   Previous c/s, plans TOLAC  OB History     Gravida  3   Para  1   Term  1   Preterm      AB  1   Living  1      SAB  1   IAB      Ectopic      Multiple      Live Births  1           Past Medical History:  Diagnosis Date   Anemia    Medical history non-contributory    S/P cesarean section 11/17/2013   Thyroid nodule    Vaginal Pap smear, abnormal    Vitamin D deficiency     Past Surgical History:  Procedure Laterality Date   CESAREAN SECTION N/A 11/17/2013   Procedure: CESAREAN SECTION;  Surgeon: Sherian Rein, MD;  Location: [facility];  Service: Obstetrics;  Laterality: N/A;   WISDOM TOOTH EXTRACTION Bilateral     Family History  Problem Relation Age of Onset   Hypertension Mother    Hypertension Father    Cancer Other     Social History   Tobacco Use   Smoking status: Former    Packs/day: .5    Types: Cigarettes   Smokeless tobacco: Never  Vaping Use   Vaping Use: Never used  Substance Use Topics   Alcohol use: No    Comment: occasional   Drug use: No    Types: Marijuana    Comment: LAST USED JULY 2023    Allergies: No Known Allergies  Medications Prior to Admission  Medication Sig Dispense Refill Last Dose   ferrous sulfate 325 (65 FE) MG tablet Take 325 mg by mouth daily with breakfast.       Prenatal Vit-Fe Fumarate-FA (MULTIVITAMIN-PRENATAL) 27-0.8 MG TABS tablet Take 1 tablet by mouth daily at 12 noon.      Results for orders placed or performed during the hospital encounter of 07/29/22 (from the past 48 hour(s))  Urinalysis, Routine w reflex microscopic -Urine, Clean Catch     Status: Abnormal   Collection Time: 07/29/22  5:32 PM  Result Value Ref Range   Color, Urine STRAW (A) YELLOW   APPearance CLEAR CLEAR   Specific Gravity, Urine 1.004 (L) 1.005 - 1.030   pH 6.0 5.0 - 8.0   Glucose, UA NEGATIVE NEGATIVE mg/dL   Hgb urine dipstick SMALL (A) NEGATIVE   Bilirubin Urine NEGATIVE NEGATIVE   Ketones, ur NEGATIVE NEGATIVE mg/dL   Protein, ur NEGATIVE NEGATIVE mg/dL   Nitrite NEGATIVE NEGATIVE   Leukocytes,Ua TRACE (A) NEGATIVE   RBC / HPF 0-5 0 - 5 RBC/hpf   WBC, UA 0-5 0 - 5 WBC/hpf   Bacteria, UA RARE (A)  NONE SEEN   Squamous Epithelial / HPF 0-5 0 - 5 /HPF    Comment: Performed at [facility], [address] [place], [place] [address]  Protein / creatinine ratio, urine     Status: None   Collection Time: 07/29/22  5:32 PM  Result Value Ref Range   Creatinine, Urine 35 mg/dL   Total Protein, Urine <6 mg/dL    Comment: NO NORMAL RANGE ESTABLISHED FOR THIS TEST   Protein Creatinine Ratio        0.00 - 0.15 mg/mg[Cre]    Comment: RESULT BELOW REPORTABLE RANGE, UNABLE TO CALCULATE. Performed at [facility], [address], [place], [place] [address]   Type and screen [facility]     Status: None (Preliminary result)   Collection Time: 07/29/22  5:47 PM  Result Value Ref Range   ABO/RH(D) PENDING    Antibody Screen PENDING    Sample Expiration      08/01/2022,2359 Performed at [facility], [address], [place], [place] [address]   CBC     Status: Abnormal   Collection Time: 07/29/22  5:49 PM  Result Value Ref Range   WBC 11.6 (H) 4.0 - 10.5 K/uL   RBC 3.90 3.87 - 5.11 MIL/uL   Hemoglobin 11.8 (L) 12.0 - 15.0  g/dL   HCT 91.4 (L) 78.2 - 95.6 %   MCV 90.5 80.0 - 100.0 fL   MCH 30.3 26.0 - 34.0 pg   MCHC 33.4 30.0 - 36.0 g/dL   RDW 21.3 08.6 - 57.8 %   Platelets 196 150 - 400 K/uL   nRBC 0.0 0.0 - 0.2 %    Comment: Performed at [facility], [address], [place], [place] [address]  Comprehensive metabolic panel     Status: Abnormal   Collection Time: 07/29/22  5:49 PM  Result Value Ref Range   Sodium 134 (L) 135 - 145 mmol/L   Potassium 3.5 3.5 - 5.1 mmol/L   Chloride 104 98 - 111 mmol/L   CO2 19 (L) 22 - 32 mmol/L   Glucose, Bld 73 70 - 99 mg/dL    Comment: Glucose reference range applies only to samples taken after fasting for at least 8 hours.   BUN 5 (L) 6 - 20 mg/dL   Creatinine, Ser 9.52 0.44 - 1.00 mg/dL   Calcium 8.5 (L) 8.9 - 10.3 mg/dL   Total Protein 5.6 (L) 6.5 - 8.1 g/dL   Albumin 2.3 (L) 3.5 - 5.0 g/dL   AST 12 (L) 15 - 41 U/L   ALT 11 0 - 44 U/L   Alkaline Phosphatase 148 (H) 38 - 126 U/L   Total Bilirubin 0.5 0.3 - 1.2 mg/dL   GFR, Estimated >84 >13 mL/min    Comment: (NOTE) Calculated using the CKD-EPI Creatinine Equation (2021)    Anion gap 11 5 - 15    Comment: Performed at [facility], [address], [place], [place] [address]    Review of Systems  Eyes:  Positive for photophobia.  Gastrointestinal:  Negative for abdominal pain.  Neurological:  Positive for headaches.   Physical Exam   Blood pressure (!) 149/97, pulse 63, temperature 98.3 F (36.8 C), temperature source Oral, resp. rate 19, height 5\' 5"  (1.651 m), weight 83.1 kg, last menstrual period 11/11/2021, SpO2 99 %, unknown if currently breastfeeding.  Patient Vitals for the past 24 hrs:  BP Temp Temp src Pulse Resp  SpO2 Height Weight  07/29/22 1901 (!) 157/94 -- -- 62 -- 98 % -- --  07/29/22 1856 -- -- -- -- -- 99 % -- --  07/29/22 1846 (!) 154/92 -- -- (!) 59 -- -- -- --  07/29/22 1831 (!) 169/98 -- -- 65 -- -- -- --  07/29/22 1801 (!) 150/91 -- -- 63 -- -- -- --  07/29/22 1746  (!) 146/94 -- -- 62 -- -- -- --  07/29/22 1730 (!) 149/97 -- -- 63 -- -- -- --  07/29/22 1716 (!) 156/100 98.3 F (36.8 C) Oral 69 -- -- -- --  07/29/22 1702 (!) 155/93 98.4 F (36.9 C) Oral 71 19 99 % -- --  07/29/22 1658 -- -- -- -- -- -- 5\' 5"  (1.651 m) 83.1 kg    Physical Exam Constitutional:      General: She is not in acute distress.    Appearance: Normal appearance. She is not ill-appearing, toxic-appearing or diaphoretic.  Skin:    General: Skin is warm.  Neurological:     Mental Status: She is alert and oriented to person, place, and time.  Psychiatric:        Behavior: Behavior normal.    Fetal Tracing: Baseline: 125 bpm Variability: Moderate  Accelerations: 15x15 Decelerations: None Toco:  Occasional.  MAU Course  Procedures  MDM  CBC, CMP, Protein Creatine ratio.  Took tylenol earlier today without relief, Fioricet given 2 tablets.  Given headache and elevated BP will start magnesium Reviewed patient with Dr. Despina Hidden, unable to reach Dr. Sallye Ober at this time.  Attempted to call Dr. Sallye Ober @ [phone] and 1825 Attempted to call Dr. Sallye Ober @ [phone]  Assessment and Plan    A:  1. Severe preeclampsia, third trimester   2. [redacted] weeks gestation of pregnancy      P:  Admit to labor and delivery Dr. Sallye Ober @ bedside @ 1945 and report given Care turned over to Dr. Urbano Heir, Harolyn Rutherford, NP 07/29/2022 7:41 PM

## 2022-07-29 NOTE — Progress Notes (Addendum)
Chart reviewed remotely. NST: 150 baseline, moderate variability, reactive.  TOCO: Irregular contractions in 10 minutes CVX: 1/30%/-3 per RN.     07/29/2022   10:31 PM 07/29/2022   10:01 PM 07/29/2022    9:31 PM  Vitals with BMI  Systolic 138 143 478  Diastolic 92 84 92  Pulse 69 63 66   Induction of labor for preeclampsia with severe features at 37 weeks 1 day EGA, TOLAC candidate, Continue with pitocin titration. Continue with magnesium sulfate for seizure prophylaxis.  Dr. Sallye Ober.  07/29/22.

## 2022-07-29 NOTE — H&P (Deleted)
Duplicate H&P therefore was erased.

## 2022-07-29 NOTE — MAU Note (Signed)
Valerie Chapman is a 32 y.o. at [redacted]w[redacted]d here in MAU reporting: she was sent from [facility] office for BP evaluation.  Endorses current HA and seeing spots.  Reports took Tylenol @ noon and HA relieved but returned. Denies epigastric pain.  Denies VB or LOF.  Endorses +FM. LMP: NA Onset of complaint: today Pain score: 7 Vitals:   07/29/22 1702  BP: (!) 155/93  Pulse: 71  Resp: 19  Temp: 98.4 F (36.9 C)  SpO2: 99%     FHT: 150 bpm Lab orders placed from triage:   UA

## 2022-07-29 NOTE — H&P (Addendum)
Valerie Chapman is a 32 y.o. female presenting for induction of labor for preeclampsia with severe features.  She was found to have some elevated blood pressures in the office and confirmed in the MAU.  She also reported a headache that did not improve with tylenol use. In the MAU she received fioricet and with improvement in headache symptoms. She still feels some residual pain and puffiness behind her right eye.   G3P1011 at 37 weeks 1 day EGA, LMP 11/11/21 EDC 08/18/22 by LMP consistent with first trimester U/S.    Prenatal care was received at [facility] and was significant for: Cystic fibrosis carries, FOB had negative testing.  History of cesarean section for arrest of dilation, baby was 7lbs 13 oz, had lower uterine transverse incision with 2 layer closure. She is for a trial of labor after cesarean section.    OB History     Gravida  3   Para  1   Term  1   Preterm      AB  1   Living  1      SAB  1   IAB      Ectopic      Multiple      Live Births  1          Past Medical History:  Diagnosis Date   Anemia    Medical history non-contributory    S/P cesarean section 11/17/2013   Thyroid nodule    Vaginal Pap smear, abnormal    Vitamin D deficiency    Past Surgical History:  Procedure Laterality Date   CESAREAN SECTION N/A 11/17/2013   Procedure: CESAREAN SECTION;  Surgeon: Sherian Rein, MD;  Location: [facility];  Service: Obstetrics;  Laterality: N/A;   WISDOM TOOTH EXTRACTION Bilateral    Family History: family history includes Cancer in an other family member; Hypertension in her father and mother; Lung cancer in her mother. Social History:  reports that she has quit smoking. Her smoking use included cigarettes. She smoked an average of .5 packs per day. She has never used smokeless tobacco. She reports that she does not drink alcohol and does not use drugs.     Maternal Diabetes: No Genetic Screening: Normal Maternal  Ultrasounds/Referrals: Normal Fetal Ultrasounds or other Referrals:  None Maternal Substance Abuse:  No Significant Maternal Medications:  None Significant Maternal Lab Results:  Group B Strep negative Number of Prenatal Visits:greater than 3 verified prenatal visits Other Comments:  None  Review of Systems History Dilation: Closed Exam by:: Dr. Sallye Ober Blood pressure (!) 156/87, pulse 60, temperature 98.3 F (36.8 C), temperature source Oral, resp. rate 19, height 5\' 5"  (1.651 m), weight 83.1 kg, last menstrual period 11/11/2021, SpO2 99 %, unknown if currently breastfeeding.    07/29/2022    9:01 PM 07/29/2022    8:51 PM 07/29/2022    8:07 PM  Vitals with BMI  Systolic 155 169 213  Diastolic 102 93 87  Pulse 67 66 60    Cervix: Closed/20%/-3.  EFM: 130 Baseline, moderate variability, reactive. TOCO: Irregular contractions every 10 minutes.  Exam Physical Exam  Constitutional: She is oriented to person, place, and time. She appears well-developed and well-nourished.  HENT:  Head: Normocephalic and atraumatic.  Neck: Normal range of motion.  Cardiovascular: Normal rate, regular rhythm and normal heart sounds.   Respiratory: Effort normal and breath sounds normal.  GI: Soft. Bowel sounds are normal. Genitorurinary: Gravid uterus, appropriate for gestational age.  EFW by Le[place] 7 lbs. With adequate pelvis.    Neurological: She is alert and oriented to person, place, and time.  Skin: Skin is warm and dry.  Psychiatric: She has a normal mood and affect. Her behavior is normal.    Extremities: Warm and well perfused. 2+ patellar reflex bilaterally.  Current Outpatient Medications  Medication Instructions   acetaminophen (TYLENOL) 650 mg, Oral, Every 6 hours PRN   ferrous sulfate 325 mg, Oral, Daily with breakfast   Prenatal Vit-Fe Fumarate-FA (MULTIVITAMIN-PRENATAL) 27-0.8 MG TABS tablet 1 tablet, Daily    No Known Allergies   Prenatal labs: ABO, Rh: --/--/A POS (04/30  1747) Antibody: NEG (04/30 1747) Rubella:  Immune RPR:  NR  HBsAg:   Negative HIV:  Negative  GBS:   Negative.   Recent Results (from the past 2160 hour(s))  Urinalysis, Routine w reflex microscopic -Urine, Clean Catch     Status: Abnormal   Collection Time: 07/29/22  5:32 PM  Result Value Ref Range   Color, Urine STRAW (A) YELLOW   APPearance CLEAR CLEAR   Specific Gravity, Urine 1.004 (L) 1.005 - 1.030   pH 6.0 5.0 - 8.0   Glucose, UA NEGATIVE NEGATIVE mg/dL   Hgb urine dipstick SMALL (A) NEGATIVE   Bilirubin Urine NEGATIVE NEGATIVE   Ketones, ur NEGATIVE NEGATIVE mg/dL   Protein, ur NEGATIVE NEGATIVE mg/dL   Nitrite NEGATIVE NEGATIVE   Leukocytes,Ua TRACE (A) NEGATIVE   RBC / HPF 0-5 0 - 5 RBC/hpf   WBC, UA 0-5 0 - 5 WBC/hpf   Bacteria, UA RARE (A) NONE SEEN   Squamous Epithelial / HPF 0-5 0 - 5 /HPF    Comment: Performed at [facility], [address] [place], [place] [address]  Protein / creatinine ratio, urine     Status: None   Collection Time: 07/29/22  5:32 PM  Result Value Ref Range   Creatinine, Urine 35 mg/dL   Total Protein, Urine <6 mg/dL    Comment: NO NORMAL RANGE ESTABLISHED FOR THIS TEST   Protein Creatinine Ratio        0.00 - 0.15 mg/mg[Cre]    Comment: RESULT BELOW REPORTABLE RANGE, UNABLE TO CALCULATE. Performed at [facility], [address] [place], [place] [address]   Type and screen [facility]     Status: None   Collection Time: 07/29/22  5:47 PM  Result Value Ref Range   ABO/RH(D) A POS    Antibody Screen NEG    Sample Expiration      08/01/2022,2359 Performed at [facility], [address] [place], [place] [address]   CBC     Status: Abnormal   Collection Time: 07/29/22  5:49 PM  Result Value Ref Range   WBC 11.6 (H) 4.0 - 10.5 K/uL   RBC 3.90 3.87 - 5.11 MIL/uL   Hemoglobin 11.8 (L) 12.0 - 15.0 g/dL   HCT 13.2 (L) 44.0 - 10.2 %   MCV 90.5 80.0 - 100.0 fL   MCH 30.3 26.0 - 34.0 pg   MCHC  33.4 30.0 - 36.0 g/dL   RDW 72.5 36.6 - 44.0 %   Platelets 196 150 - 400 K/uL   nRBC 0.0 0.0 - 0.2 %    Comment: Performed at [facility], [address], [place], [place] [address]  Comprehensive metabolic panel     Status: Abnormal   Collection Time: 07/29/22  5:49 PM  Result Value Ref Range  Sodium 134 (L) 135 - 145 mmol/L   Potassium 3.5 3.5 - 5.1 mmol/L   Chloride 104 98 - 111 mmol/L   CO2 19 (L) 22 - 32 mmol/L   Glucose, Bld 73 70 - 99 mg/dL    Comment: Glucose reference range applies only to samples taken after fasting for at least 8 hours.   BUN 5 (L) 6 - 20 mg/dL   Creatinine, Ser 0.98 0.44 - 1.00 mg/dL   Calcium 8.5 (L) 8.9 - 10.3 mg/dL   Total Protein 5.6 (L) 6.5 - 8.1 g/dL   Albumin 2.3 (L) 3.5 - 5.0 g/dL   AST 12 (L) 15 - 41 U/L   ALT 11 0 - 44 U/L   Alkaline Phosphatase 148 (H) 38 - 126 U/L   Total Bilirubin 0.5 0.3 - 1.2 mg/dL   GFR, Estimated >11 >91 mL/min    Comment: (NOTE) Calculated using the CKD-EPI Creatinine Equation (2021)    Anion gap 11 5 - 15    Comment: Performed at [facility], [address], [place], [place] [address]     Assessment/Plan: 32 y/o G3P1011 at 37 weeks 1 day EGA, induction of labor for preeclampsia with severe features based on elevated pressures and headache,  - Admit to Labor and Delivery as per admit orders for induction of labor.  - I discussed with patient risks, benefits and alternatives of labor induction including risks of cesarean delivery.  We discussed risks of induction agents including effects on fetal heart beat, contraction pattern and need for close monitoring.  Patient expressed understanding of all this and desired to proceed with the induction.  Will start with low dose pitocin for cervical ripening.   - TOLAC consent form signed in office on 06/24/22.  Patient understands risks of trial of labor after a cesarean section to include but not limited to risks of uterine rupture, bleeding, infection, damage  to organs and accompanying comorbidities to fetus.  Prescilla Sours, MD.  07/29/2022, 8:32 PM

## 2022-07-30 LAB — CBC
HCT: 36.1 % (ref 36.0–46.0)
HCT: 34.1 % — ABNORMAL LOW (ref 36.0–46.0)
Hemoglobin: 12.3 g/dL (ref 12.0–15.0)
Hemoglobin: 11.7 g/dL — ABNORMAL LOW (ref 12.0–15.0)
MCH: 30.8 pg (ref 26.0–34.0)
MCH: 30.6 pg (ref 26.0–34.0)
MCHC: 34.1 g/dL (ref 30.0–36.0)
MCHC: 34.3 g/dL (ref 30.0–36.0)
MCV: 90.5 fL (ref 80.0–100.0)
MCV: 89.3 fL (ref 80.0–100.0)
Platelets: 194 10*3/uL (ref 150–400)
Platelets: 194 10*3/uL (ref 150–400)
RBC: 3.99 MIL/uL (ref 3.87–5.11)
RBC: 3.82 MIL/uL — ABNORMAL LOW (ref 3.87–5.11)
RDW: 13.6 % (ref 11.5–15.5)
RDW: 13.9 % (ref 11.5–15.5)
WBC: 10.7 10*3/uL — ABNORMAL HIGH (ref 4.0–10.5)
WBC: 12.8 10*3/uL — ABNORMAL HIGH (ref 4.0–10.5)
nRBC: 0 % (ref 0.0–0.2)
nRBC: 0 % (ref 0.0–0.2)

## 2022-07-30 LAB — RPR: RPR Ser Ql: NONREACTIVE

## 2022-07-30 MED ORDER — BUTALBITAL-APAP-CAFFEINE 50-325-40 MG PO TABS
2.0000 | ORAL_TABLET | ORAL | Status: DC | PRN
  Administered 2022-07-30 – 2022-07-31 (×9): 2 via ORAL
  Filled 2022-07-30 (×9): qty 2

## 2022-07-30 NOTE — Progress Notes (Signed)
Remote note BP 129/83   Pulse (!) 57   Temp 98.4 F (36.9 C) (Oral)   Resp 17   Ht 5\' 5"  (1.651 m)   Wt 83.1 kg   LMP 11/11/2021   SpO2 97%   BMI 30.50 kg/m  Cat 1 Toco q 3-5 min Foley fell out Attempted AROM unsuccessful Will continue pitocin to 20 mu then will give a slight break and restart cervical ripening Monitor closely

## 2022-07-30 NOTE — Progress Notes (Signed)
Pt comfortable in bed without problems.  Mild headache.  NOR RUQ pain or epigastric pain BP (!) 146/91   Pulse 62   Temp 98.1 F (36.7 C) (Oral)   Resp 16   Ht 5\' 5"  (1.651 m)   Wt 83.1 kg   LMP 11/11/2021   SpO2 100%   BMI 30.50 kg/m  CV RRR  Lungs CTA B Cx 2/40/ ballotable EXT neg for homans sign B .  2 plus  DTR  CBC    Component Value Date/Time   WBC 10.7 (H) 07/30/2022 0209   RBC 3.99 07/30/2022 0209   HGB 12.3 07/30/2022 0209   HCT 36.1 07/30/2022 0209   PLT 194 07/30/2022 0209   MCV 90.5 07/30/2022 0209   MCH 30.8 07/30/2022 0209   MCHC 34.1 07/30/2022 0209   RDW 13.6 07/30/2022 0209   IOL for preeclampsia with severe features Foley bulb placed in the cx Pitocin decreased to 6 mu min Will AROM once catheter falls out Pt is stable.  Good urine output.   Continue magnesium Cat 1  Toco q 3-5.  Will continue to attempt VBAC

## 2022-07-31 ENCOUNTER — Inpatient Hospital Stay (HOSPITAL_COMMUNITY): Payer: Medicaid Other | Admitting: Anesthesiology

## 2022-07-31 ENCOUNTER — Encounter (HOSPITAL_COMMUNITY)
Admission: AD | Disposition: A | Discharge status: Home / Self Care | Payer: Self-pay | Source: Home / Self Care | Attending: Obstetrics and Gynecology

## 2022-07-31 ENCOUNTER — Encounter (HOSPITAL_COMMUNITY): Payer: Self-pay | Admitting: Obstetrics & Gynecology

## 2022-07-31 DIAGNOSIS — O9902 Anemia complicating childbirth: Secondary | ICD-10-CM

## 2022-07-31 DIAGNOSIS — O34211 Maternal care for low transverse scar from previous cesarean delivery: Secondary | ICD-10-CM

## 2022-07-31 DIAGNOSIS — Z3A37 37 weeks gestation of pregnancy: Secondary | ICD-10-CM

## 2022-07-31 DIAGNOSIS — O1414 Severe pre-eclampsia complicating childbirth: Secondary | ICD-10-CM

## 2022-07-31 DIAGNOSIS — D649 Anemia, unspecified: Secondary | ICD-10-CM

## 2022-07-31 LAB — CBC
HCT: 34.7 % — ABNORMAL LOW (ref 36.0–46.0)
HCT: 34.5 % — ABNORMAL LOW (ref 36.0–46.0)
Hemoglobin: 11.5 g/dL — ABNORMAL LOW (ref 12.0–15.0)
Hemoglobin: 11.8 g/dL — ABNORMAL LOW (ref 12.0–15.0)
MCH: 30.3 pg (ref 26.0–34.0)
MCH: 30.7 pg (ref 26.0–34.0)
MCHC: 33.1 g/dL (ref 30.0–36.0)
MCHC: 34.2 g/dL (ref 30.0–36.0)
MCV: 91.3 fL (ref 80.0–100.0)
MCV: 89.8 fL (ref 80.0–100.0)
Platelets: 190 10*3/uL (ref 150–400)
Platelets: 181 10*3/uL (ref 150–400)
RBC: 3.8 MIL/uL — ABNORMAL LOW (ref 3.87–5.11)
RBC: 3.84 MIL/uL — ABNORMAL LOW (ref 3.87–5.11)
RDW: 14 % (ref 11.5–15.5)
RDW: 14 % (ref 11.5–15.5)
WBC: 10.6 10*3/uL — ABNORMAL HIGH (ref 4.0–10.5)
WBC: 10.8 10*3/uL — ABNORMAL HIGH (ref 4.0–10.5)
nRBC: 0 % (ref 0.0–0.2)
nRBC: 0 % (ref 0.0–0.2)

## 2022-07-31 LAB — COMPREHENSIVE METABOLIC PANEL
ALT: 13 U/L (ref 0–44)
AST: 18 U/L (ref 15–41)
Albumin: 2.1 g/dL — ABNORMAL LOW (ref 3.5–5.0)
Alkaline Phosphatase: 150 U/L — ABNORMAL HIGH (ref 38–126)
Anion gap: 7 (ref 5–15)
BUN: <5 mg/dL — ABNORMAL LOW (ref 6–20)
CO2: 21 mmol/L — ABNORMAL LOW (ref 22–32)
Calcium: 6.7 mg/dL — ABNORMAL LOW (ref 8.9–10.3)
Chloride: 104 mmol/L (ref 98–111)
Creatinine, Ser: 0.76 mg/dL (ref 0.44–1.00)
GFR, Estimated: >60 mL/min (ref >60)
Glucose, Bld: 99 mg/dL (ref 70–99)
Potassium: 3.7 mmol/L (ref 3.5–5.1)
Sodium: 132 mmol/L — ABNORMAL LOW (ref 135–145)
Total Bilirubin: 0.4 mg/dL (ref 0.3–1.2)
Total Protein: 5.2 g/dL — ABNORMAL LOW (ref 6.5–8.1)

## 2022-07-31 SURGERY — Surgical Case
Anesthesia: Spinal

## 2022-07-31 MED ORDER — FENTANYL CITRATE (PF) 100 MCG/2ML IJ SOLN
INTRAMUSCULAR | Status: DC | PRN
  Administered 2022-07-31: 15 ug via INTRATHECAL

## 2022-07-31 MED ORDER — LACTATED RINGERS IV SOLN: INTRAVENOUS | Status: DC

## 2022-07-31 MED ORDER — AMISULPRIDE (ANTIEMETIC) 5 MG/2ML IV SOLN: 10.0000 mg | Freq: Once | INTRAVENOUS | Status: DC | PRN

## 2022-07-31 MED ORDER — OXYTOCIN-SODIUM CHLORIDE 30-0.9 UT/500ML-% IV SOLN
INTRAVENOUS | Status: AC
  Filled 2022-07-31: qty 500

## 2022-07-31 MED ORDER — ACETAMINOPHEN 10 MG/ML IV SOLN
INTRAVENOUS | Status: AC
  Filled 2022-07-31: qty 200

## 2022-07-31 MED ORDER — SODIUM CHLORIDE 0.9% FLUSH: 3.0000 mL | INTRAVENOUS | Status: DC | PRN

## 2022-07-31 MED ORDER — ACETAMINOPHEN 500 MG PO TABS: 1000.0000 mg | ORAL_TABLET | Freq: Four times a day (QID) | ORAL | Status: DC

## 2022-07-31 MED ORDER — DIBUCAINE (PERIANAL) 1 % EX OINT: 1.0000 | TOPICAL_OINTMENT | CUTANEOUS | Status: DC | PRN

## 2022-07-31 MED ORDER — WITCH HAZEL-GLYCERIN EX PADS: 1.0000 | MEDICATED_PAD | CUTANEOUS | Status: DC | PRN

## 2022-07-31 MED ORDER — OXYCODONE HCL 5 MG PO TABS
5.0000 mg | ORAL_TABLET | ORAL | Status: DC | PRN
  Administered 2022-08-03: 5 mg via ORAL
  Filled 2022-07-31: qty 1

## 2022-07-31 MED ORDER — COCONUT OIL OIL: 1.0000 | TOPICAL_OIL | Status: DC | PRN

## 2022-07-31 MED ORDER — TETANUS-DIPHTH-ACELL PERTUSSIS 5-2.5-18.5 LF-MCG/0.5 IM SUSY
0.5000 mL | PREFILLED_SYRINGE | Freq: Once | INTRAMUSCULAR | Status: AC
  Administered 2022-08-02: 0.5 mL via INTRAMUSCULAR
  Filled 2022-07-31: qty 0.5

## 2022-07-31 MED ORDER — PHENYLEPHRINE HCL-NACL 20-0.9 MG/250ML-% IV SOLN
INTRAVENOUS | Status: AC
  Filled 2022-07-31: qty 250

## 2022-07-31 MED ORDER — KETOROLAC TROMETHAMINE 30 MG/ML IJ SOLN
30.0000 mg | Freq: Four times a day (QID) | INTRAMUSCULAR | Status: DC | PRN
  Administered 2022-07-31: 30 mg via INTRAMUSCULAR

## 2022-07-31 MED ORDER — MEPERIDINE HCL 25 MG/ML IJ SOLN: 6.2500 mg | INTRAMUSCULAR | Status: DC | PRN

## 2022-07-31 MED ORDER — TRANEXAMIC ACID-NACL 1000-0.7 MG/100ML-% IV SOLN
INTRAVENOUS | Status: DC | PRN
  Administered 2022-07-31: 1000 mg via INTRAVENOUS

## 2022-07-31 MED ORDER — OXYTOCIN-SODIUM CHLORIDE 30-0.9 UT/500ML-% IV SOLN
INTRAVENOUS | Status: DC | PRN
  Administered 2022-07-31: 200 mL via INTRAVENOUS

## 2022-07-31 MED ORDER — KETOROLAC TROMETHAMINE 30 MG/ML IJ SOLN
INTRAMUSCULAR | Status: AC
  Filled 2022-07-31: qty 1

## 2022-07-31 MED ORDER — KETOROLAC TROMETHAMINE 30 MG/ML IJ SOLN: 30.0000 mg | Freq: Four times a day (QID) | INTRAMUSCULAR | Status: DC | PRN

## 2022-07-31 MED ORDER — SIMETHICONE 80 MG PO CHEW: 80.0000 mg | CHEWABLE_TABLET | ORAL | Status: DC | PRN

## 2022-07-31 MED ORDER — SCOPOLAMINE 1 MG/3DAYS TD PT72
1.0000 | MEDICATED_PATCH | Freq: Once | TRANSDERMAL | Status: AC
  Administered 2022-07-31: 1.5 mg via TRANSDERMAL

## 2022-07-31 MED ORDER — ACETAMINOPHEN 500 MG PO TABS
1000.0000 mg | ORAL_TABLET | Freq: Four times a day (QID) | ORAL | Status: DC
  Administered 2022-07-31 – 2022-08-04 (×14): 1000 mg via ORAL
  Filled 2022-07-31 (×15): qty 2

## 2022-07-31 MED ORDER — CEFAZOLIN SODIUM-DEXTROSE 2-3 GM-%(50ML) IV SOLR
INTRAVENOUS | Status: DC | PRN
  Administered 2022-07-31: 2 g via INTRAVENOUS

## 2022-07-31 MED ORDER — FENTANYL CITRATE (PF) 100 MCG/2ML IJ SOLN
INTRAMUSCULAR | Status: AC
  Filled 2022-07-31: qty 2

## 2022-07-31 MED ORDER — MENTHOL 3 MG MT LOZG: 1.0000 | LOZENGE | OROMUCOSAL | Status: DC | PRN

## 2022-07-31 MED ORDER — OXYTOCIN-SODIUM CHLORIDE 30-0.9 UT/500ML-% IV SOLN: 1.0000 m[IU]/min | INTRAVENOUS | Status: DC

## 2022-07-31 MED ORDER — PHENYLEPHRINE HCL-NACL 20-0.9 MG/250ML-% IV SOLN
INTRAVENOUS | Status: DC | PRN
  Administered 2022-07-31: 30 ug/min via INTRAVENOUS

## 2022-07-31 MED ORDER — DIPHENHYDRAMINE HCL 25 MG PO CAPS: 25.0000 mg | ORAL_CAPSULE | ORAL | Status: DC | PRN

## 2022-07-31 MED ORDER — OXYTOCIN-SODIUM CHLORIDE 30-0.9 UT/500ML-% IV SOLN: 2.5000 [IU]/h | INTRAVENOUS | Status: AC

## 2022-07-31 MED ORDER — ONDANSETRON HCL 4 MG/2ML IJ SOLN
INTRAMUSCULAR | Status: DC | PRN
  Administered 2022-07-31: 4 mg via INTRAVENOUS

## 2022-07-31 MED ORDER — SODIUM CHLORIDE 0.9 % IR SOLN
Status: DC | PRN
  Administered 2022-07-31: 1

## 2022-07-31 MED ORDER — PRENATAL MULTIVITAMIN CH
1.0000 | ORAL_TABLET | Freq: Every day | ORAL | Status: DC
  Administered 2022-08-01 – 2022-08-04 (×4): 1 via ORAL
  Filled 2022-07-31 (×4): qty 1

## 2022-07-31 MED ORDER — OXYCODONE HCL 5 MG/5ML PO SOLN: 5.0000 mg | Freq: Once | ORAL | Status: DC | PRN

## 2022-07-31 MED ORDER — MORPHINE SULFATE (PF) 0.5 MG/ML IJ SOLN
INTRAMUSCULAR | Status: DC | PRN
  Administered 2022-07-31: .15 mg via INTRATHECAL

## 2022-07-31 MED ORDER — NIFEDIPINE ER OSMOTIC RELEASE 30 MG PO TB24
30.0000 mg | ORAL_TABLET | Freq: Every day | ORAL | Status: DC
  Administered 2022-07-31 – 2022-08-01 (×2): 30 mg via ORAL
  Filled 2022-07-31 (×2): qty 1

## 2022-07-31 MED ORDER — IBUPROFEN 600 MG PO TABS
600.0000 mg | ORAL_TABLET | Freq: Four times a day (QID) | ORAL | Status: DC
  Administered 2022-08-01 – 2022-08-04 (×12): 600 mg via ORAL
  Filled 2022-07-31 (×12): qty 1

## 2022-07-31 MED ORDER — DIPHENHYDRAMINE HCL 50 MG/ML IJ SOLN: 12.5000 mg | INTRAMUSCULAR | Status: DC | PRN

## 2022-07-31 MED ORDER — ONDANSETRON HCL 4 MG/2ML IJ SOLN: 4.0000 mg | Freq: Once | INTRAMUSCULAR | Status: DC | PRN

## 2022-07-31 MED ORDER — HYDROMORPHONE HCL 1 MG/ML IJ SOLN: 0.2500 mg | INTRAMUSCULAR | Status: DC | PRN

## 2022-07-31 MED ORDER — MORPHINE SULFATE (PF) 0.5 MG/ML IJ SOLN
INTRAMUSCULAR | Status: AC
  Filled 2022-07-31: qty 10

## 2022-07-31 MED ORDER — ZOLPIDEM TARTRATE 5 MG PO TABS: 5.0000 mg | ORAL_TABLET | Freq: Every evening | ORAL | Status: DC | PRN

## 2022-07-31 MED ORDER — SENNOSIDES-DOCUSATE SODIUM 8.6-50 MG PO TABS
2.0000 | ORAL_TABLET | Freq: Every day | ORAL | Status: DC
  Administered 2022-08-01 – 2022-08-03 (×3): 2 via ORAL
  Filled 2022-07-31 (×4): qty 2

## 2022-07-31 MED ORDER — CEFAZOLIN SODIUM-DEXTROSE 2-4 GM/100ML-% IV SOLN
2.0000 g | Freq: Once | INTRAVENOUS | Status: AC
  Administered 2022-07-31: 2 g via INTRAVENOUS
  Filled 2022-07-31: qty 100

## 2022-07-31 MED ORDER — STERILE WATER FOR IRRIGATION IR SOLN
Status: DC | PRN
  Administered 2022-07-31: 1000 mL

## 2022-07-31 MED ORDER — NALOXONE HCL 0.4 MG/ML IJ SOLN: 0.4000 mg | INTRAMUSCULAR | Status: DC | PRN

## 2022-07-31 MED ORDER — DEXAMETHASONE SODIUM PHOSPHATE 10 MG/ML IJ SOLN
INTRAMUSCULAR | Status: AC
  Filled 2022-07-31: qty 2

## 2022-07-31 MED ORDER — SODIUM CHLORIDE 0.9 % IV SOLN
500.0000 mg | Freq: Once | INTRAVENOUS | Status: AC
  Administered 2022-07-31: 500 mg via INTRAVENOUS
  Filled 2022-07-31: qty 5

## 2022-07-31 MED ORDER — ONDANSETRON HCL 4 MG/2ML IJ SOLN: 4.0000 mg | Freq: Three times a day (TID) | INTRAMUSCULAR | Status: DC | PRN

## 2022-07-31 MED ORDER — BUPIVACAINE IN DEXTROSE 0.75-8.25 % IT SOLN
INTRATHECAL | Status: DC | PRN
  Administered 2022-07-31: 1.6 mL via INTRATHECAL

## 2022-07-31 MED ORDER — DIPHENHYDRAMINE HCL 25 MG PO CAPS: 25.0000 mg | ORAL_CAPSULE | Freq: Four times a day (QID) | ORAL | Status: DC | PRN

## 2022-07-31 MED ORDER — NALOXONE HCL 4 MG/10ML IJ SOLN: 1.0000 ug/kg/h | INTRAVENOUS | Status: DC | PRN

## 2022-07-31 MED ORDER — SCOPOLAMINE 1 MG/3DAYS TD PT72
MEDICATED_PATCH | TRANSDERMAL | Status: AC
  Filled 2022-07-31: qty 1

## 2022-07-31 MED ORDER — OXYCODONE HCL 5 MG PO TABS: 5.0000 mg | ORAL_TABLET | Freq: Once | ORAL | Status: DC | PRN

## 2022-07-31 MED ORDER — KETOROLAC TROMETHAMINE 30 MG/ML IJ SOLN: 30.0000 mg | Freq: Once | INTRAMUSCULAR | Status: DC | PRN

## 2022-07-31 MED ORDER — SIMETHICONE 80 MG PO CHEW
80.0000 mg | CHEWABLE_TABLET | Freq: Three times a day (TID) | ORAL | Status: DC
  Administered 2022-08-01 – 2022-08-04 (×11): 80 mg via ORAL
  Filled 2022-07-31 (×11): qty 1

## 2022-07-31 MED ORDER — KETOROLAC TROMETHAMINE 30 MG/ML IJ SOLN
30.0000 mg | Freq: Four times a day (QID) | INTRAMUSCULAR | Status: AC
  Administered 2022-08-01 (×3): 30 mg via INTRAVENOUS
  Filled 2022-07-31 (×3): qty 1

## 2022-07-31 MED ORDER — ONDANSETRON HCL 4 MG/2ML IJ SOLN
INTRAMUSCULAR | Status: AC
  Filled 2022-07-31: qty 2

## 2022-07-31 MED ORDER — DEXAMETHASONE SODIUM PHOSPHATE 10 MG/ML IJ SOLN
INTRAMUSCULAR | Status: DC | PRN
  Administered 2022-07-31: 10 mg via INTRAVENOUS

## 2022-07-31 SURGICAL SUPPLY — 37 items
APL PRP STRL LF DISP 70% ISPRP (MISCELLANEOUS) ×2
APL SKNCLS STERI-STRIP NONHPOA (GAUZE/BANDAGES/DRESSINGS) ×1
BENZOIN TINCTURE PRP APPL 2/3 (GAUZE/BANDAGES/DRESSINGS) ×1 IMPLANT
CHLORAPREP W/TINT 26 (MISCELLANEOUS) ×2 IMPLANT
CLAMP UMBILICAL CORD (MISCELLANEOUS) IMPLANT
CLOTH BEACON ORANGE TIMEOUT ST (SAFETY) ×1 IMPLANT
DRSG OPSITE POSTOP 4X10 (GAUZE/BANDAGES/DRESSINGS) ×1 IMPLANT
ELECT REM PT RETURN 9FT ADLT (ELECTROSURGICAL) ×1
ELECTRODE REM PT RTRN 9FT ADLT (ELECTROSURGICAL) ×1 IMPLANT
EVACUATOR SILICONE 100CC (DRAIN) IMPLANT
EXTRACTOR VACUUM KIWI (MISCELLANEOUS) ×1 IMPLANT
GLOVE BIO SURGEON STRL SZ7 (GLOVE) ×1 IMPLANT
GLOVE BIOGEL PI IND STRL 7.0 (GLOVE) ×3 IMPLANT
GOWN STRL REUS W/TWL LRG LVL3 (GOWN DISPOSABLE) ×2 IMPLANT
KIT ABG SYR 3ML LUER SLIP (SYRINGE) IMPLANT
NDL HYPO 25X5/8 SAFETYGLIDE (NEEDLE) IMPLANT
NEEDLE HYPO 25X5/8 SAFETYGLIDE (NEEDLE) IMPLANT
NS IRRIG 1000ML POUR BTL (IV SOLUTION) ×1 IMPLANT
PACK C SECTION WH (CUSTOM PROCEDURE TRAY) ×1 IMPLANT
PAD OB MATERNITY 4.3X12.25 (PERSONAL CARE ITEMS) ×1 IMPLANT
RTRCTR C-SECT PINK 25CM LRG (MISCELLANEOUS) ×1 IMPLANT
STRIP CLOSURE SKIN 1/2X4 (GAUZE/BANDAGES/DRESSINGS) ×1 IMPLANT
SUT MNCRL 0 VIOLET CTX 36 (SUTURE) ×1 IMPLANT
SUT MON AB 4-0 PS1 27 (SUTURE) ×1 IMPLANT
SUT PLAIN 2 0 (SUTURE) ×1
SUT PLAIN ABS 2-0 CT1 27XMFL (SUTURE) ×1 IMPLANT
SUT VIC AB 0 CTX 36 (SUTURE) ×2
SUT VIC AB 0 CTX36XBRD ANBCTRL (SUTURE) ×2 IMPLANT
SUT VIC AB 2-0 CT1 27 (SUTURE) ×1
SUT VIC AB 2-0 CT1 TAPERPNT 27 (SUTURE) ×1 IMPLANT
SUT VIC AB 2-0 SH 27 (SUTURE)
SUT VIC AB 2-0 SH 27XBRD (SUTURE) IMPLANT
SUT VIC AB 3-0 CT1 27 (SUTURE) ×1
SUT VIC AB 3-0 CT1 TAPERPNT 27 (SUTURE) ×1 IMPLANT
TOWEL OR 17X24 6PK STRL BLUE (TOWEL DISPOSABLE) ×1 IMPLANT
TRAY FOLEY W/BAG SLVR 14FR LF (SET/KITS/TRAYS/PACK) ×1 IMPLANT
WATER STERILE IRR 1000ML POUR (IV SOLUTION) ×1 IMPLANT

## 2022-07-31 NOTE — Anesthesia Postprocedure Evaluation (Signed)
Anesthesia Post Note  Patient: Valerie Chapman  Procedure(s) Performed: CESAREAN SECTION     Patient location during evaluation: PACU Anesthesia Type: Spinal Level of consciousness: awake and alert Pain management: pain level controlled Vital Signs Assessment: post-procedure vital signs reviewed and stable Respiratory status: spontaneous breathing, nonlabored ventilation and respiratory function stable Cardiovascular status: blood pressure returned to baseline and stable Postop Assessment: no apparent nausea or vomiting Anesthetic complications: no   No notable events documented.  Last Vitals:  Vitals:   07/31/22 2015 07/31/22 2030  BP: (!) 128/108 130/82  Pulse: (!) 54 (!) 52  Resp: 15 13  Temp:    SpO2: 96% 93%    Last Pain:  Vitals:   07/31/22 2030  TempSrc:   PainSc: 0-No pain   Pain Goal: Patients Stated Pain Goal: 0 (07/30/22 0803)  LLE Motor Response: Purposeful movement (07/31/22 2030) LLE Sensation: Tingling (07/31/22 2030) RLE Motor Response: Purposeful movement (07/31/22 2030) RLE Sensation: Tingling (07/31/22 2030)     Epidural/Spinal Function Cutaneous sensation: Able to Discern Pressure (07/31/22 2015), Patient able to flex knees: Yes (07/31/22 2015), Patient able to lift hips off bed: No (07/31/22 2015), Back pain beyond tenderness at insertion site: No (07/31/22 2015), Progressively worsening motor and/or sensory loss: No (07/31/22 2015), Bowel and/or bladder incontinence post epidural: No (07/31/22 2015)  Meredith Kilbride

## 2022-07-31 NOTE — Op Note (Signed)
Cesarean Section Procedure Note  Indications: failed induction and previous uterine incision x1  Pre-operative Diagnosis: 37 week 3 day pregnancy, pre-eclampsia with severe features  Post-operative Diagnosis: same  Surgeon: Jackie Plum   Assistants: Dr. Salvadore Dom  Anesthesia: Spinal anesthesia  ASA Class: 3  Procedure Details   CS OP NOTE Information for the patient's newborn:  Feliza, Diven [[id number]]  @[id number] @    Indication and Consent:  Pt has a history of cesarean section x1 with primary section indicated due to arrest of labor. She has received adequate prenatal care in current pregnancy by [facility]. Pt. Was admitted on 4/30 at [redacted]w[redacted]d for IOL for pre-eclampsia with SF. Patient desired TOLAC. IOL was started with pitocin follow by foley balloon with pitocin and had AROM with clear fluid on 5/2 at 0850. Patient made no further progress after 6 hours after AROM and decision made to proceed with repeat c-section. Risks discussed include bleeding, infection, injury to surrounding organs including but not limited to bowel, bladder, and ureters. Also discussed risk of hysterectomy in case of life-threatening emergency, and death. Pt verbalizes understanding and wishes to proceed.  Procedures: The patient was taken to the operating room where anesthesia was found to be adequate and preoperative antibiotics were given for infection prophylaxis. She was prepared and draped in the dorsal supine position with a leftward tilt. A Pfannenstiel skin incision was made with scalpel. The incision was carried down to the fascia with Bovie electrocautery. The fascia was incised and extended laterally. The superior aspect of the fascia was grasped with Kocher clamps and the rectus muscle was dissected off with mayo scissors. In a similar fashion, the inferior aspect of the fascia was elevated and the underlying rectus muscle and pyramidalis were dissected off. Hemostasis was achieved with the  bovie. The rectus muscle was separated in the midline down to the level of pubic symphysis. Pre-peritoneal fatty tissue was extended superiorly and inferiorly to the bladder reflection with good visualization of the bladder.   The Alexis-O retractor was inserted and vesicouterine peritoneum identified. Intraabdominal survey revealed scant, clear peritoneal fluid and a thin lower uterine segment. A low transverse uterine incision was made with a scalpel, the amniotic sac was ruptured and clear fluid noted. The uterine incision was extended bluntly with lateral and upward traction.   The fetus was in cephalic presentation. The head was elevated out of the pelvis with special attention paid to avoid using the uterine incision as a fulcrum. Gentle fundal pressure was applied once the head was brought into the incision. The infant was delivered with no difficulty. Loose nuchal cord x1 easily reduced. The mouth and nose were suctioned with a bulb. The cord was clamped and cut after 1 minute delay. The infant was handed off to the awaiting NICU staff for evaluation. The placenta was delivered intact with manual massage of uterine fundus. IV oxytocin was initiated to facilitated uterine contrations.  The inside of uterus was wiped clean. The uterine incision was closed first with 0 Vicryl in a locking pattern, second imbricating layer by 0 Monocryl  in a running pattern. Hemostasis was achieved. The ovaries and tubes were found to be normal. The uterus, tubes, and ovaries were then returned to the abdominal cavity. Blood clots and fluid were wiped out of the abdomen and pelvis with moist laparotomy sponges. The uterine incision was reinspected and good hemostasis was noted.   The peritoneum was closed with 3-0 Vicryl in a continuous pattern. The muscle layer was  reapproximated with 2-0 Vicryl in an interupted pattern. The fascial layer was closed with 0 Vicryl suture. The fascial layer was closed with 0 Vicryl.  Scarpa's fascia was reapproximated with 2-0 plain gut suture in a continuous fashion. The skin was closed with 4-0 monocryl subcuticularly. The patient tolerated the procedure well. All the counts were correct times three. The patient was taken to the recovery room in a stable condition. Dr. Hester Mates was present for the entire procedure and Dr. Salvadore Dom assisted.   Findings: Liveborn female infant in cephalic presentation, normal appearing bilateral tubes and ovaries  Estimated Blood Loss:   282 mL         Urine: 150 mL, clear         Total IV Fluids:  250 mL         Specimens: Placenta         Complications:  None; patient tolerated the procedure well.         Disposition: PACU - hemodynamically stable.         Condition: stable  Attending Attestation: I was present and scrubbed and the assistant was required due to complexity of anatomy. Due to the complexity of the case, Dr. Salvadore Dom (OB fellow)  was asked to serve as surgeon assist. The assistant surgeon aided in safe handling of the pelvic structures, associated retraction, and exposure of the operative field.    Dr. Reesa Chew

## 2022-07-31 NOTE — Anesthesia Procedure Notes (Signed)
Spinal  Patient location during procedure: OR Start time: 07/31/2022 6:00 PM End time: 07/31/2022 6:03 PM Reason for block: surgical anesthesia Staffing Performed: anesthesiologist  Anesthesiologist: Lannie Fields, DO Performed by: Lannie Fields, DO Authorized by: Val Eagle, MD   Preanesthetic Checklist Completed: patient identified, IV checked, risks and benefits discussed, surgical consent, monitors and equipment checked, pre-op evaluation and timeout performed Spinal Block Patient position: sitting Prep: DuraPrep and site prepped and draped Patient monitoring: cardiac monitor, continuous pulse ox and blood pressure Approach: midline Location: L3-4 Injection technique: single-shot Needle Needle type: Pencan  Needle gauge: 24 G Needle length: 9 cm Assessment Sensory level: T6 Events: CSF return Additional Notes Functioning IV was confirmed and monitors were applied. Sterile prep and drape, including hand hygiene and sterile gloves were used. The patient was positioned and the spine was prepped. The skin was anesthetized with lidocaine.  Free flow of clear CSF was obtained prior to injecting local anesthetic into the CSF.  The spinal needle aspirated freely following injection.  The needle was carefully withdrawn.  The patient tolerated the procedure well.

## 2022-07-31 NOTE — Transfer of Care (Signed)
Immediate Anesthesia Transfer of Care Note  Patient: Valerie Chapman  Procedure(s) Performed: CESAREAN SECTION  Patient Location: PACU  Anesthesia Type:Spinal  Level of Consciousness: awake, alert , and oriented  Airway & Oxygen Therapy: Patient Spontanous Breathing  Post-op Assessment: Report given to RN and Post -op Vital signs reviewed and stable  Post vital signs: Reviewed and stable  Last Vitals:  Vitals Value Taken Time  BP 116/80 07/31/22 1930  Temp    Pulse 56 07/31/22 1933  Resp 21 07/31/22 1933  SpO2 98 % 07/31/22 1933  Vitals shown include unvalidated device data.  Last Pain:  Vitals:   07/31/22 1700  TempSrc: Oral  PainSc:       Patients Stated Pain Goal: 0 (07/30/22 0803)  Complications: No notable events documented.

## 2022-07-31 NOTE — Progress Notes (Signed)
Valerie Chapman is a 32 y.o. G3P1011 at [redacted]w[redacted]d admitted for induction of labor due to pre-eclampsia w/ SF.  Subjective: Pt. Seen and evaluated at bedside and doing well w/o complaints. AROM performed with clear fluid noted. Fetal head noted to be well applied to cervix. Pt. And fetus tolerated well. FHT responded to scalp stimulation.  Objective: BP (!) 146/92   Pulse 60   Temp 98 F (36.7 C) (Oral)   Resp 18   Ht 5\' 5"  (1.651 m)   Wt 83.1 kg   LMP 11/11/2021   SpO2 99%   BMI 30.50 kg/m  I/O last 3 completed shifts: In: 3945.6 [P.O.:1525; I.V.:2420.6] Out: 5500 [Urine:5500] Total I/O In: -  Out: 800 [Urine:800]  FHT:  FHR: 115 bpm, variability: moderate,  accelerations:  Present,  decelerations:  Absent, + scalp stim UC:   irregular, every 5-7 minutes SVE:   Dilation: 3 Effacement (%): 50 Station: -3 Exam by:: Dr. Alyse Low  Labs: Lab Results  Component Value Date   WBC 10.6 (H) 07/31/2022   HGB 11.5 (L) 07/31/2022   HCT 34.7 (L) 07/31/2022   MCV 91.3 07/31/2022   PLT 190 07/31/2022    Assessment / Plan: Induction of labor due to preeclampsia  Labor: s/p AROM on pitocin Preeclampsia:  on magnesium sulfate Fetal Wellbeing:  Category I Pain Control:  Labor support without medications I/D:  n/a Anticipated MOD:  NSVD  Jackie Plum, MD 07/31/2022, 9:06 AM

## 2022-07-31 NOTE — Anesthesia Preprocedure Evaluation (Addendum)
Anesthesia Evaluation  Patient identified by MRN, date of birth, ID band Patient awake    Reviewed: Allergy & Precautions, NPO status , Patient's Chart, lab work & pertinent test results  Airway Mallampati: II  TM Distance: >3 FB Neck ROM: Full    Dental  (+) Upper Dentures, Dental Advisory Given   Pulmonary former smoker   Pulmonary exam normal breath sounds clear to auscultation       Cardiovascular hypertension (preE w/ SF on magnesium), Normal cardiovascular exam Rhythm:Regular Rate:Normal     Neuro/Psych negative neurological ROS  negative psych ROS   GI/Hepatic negative GI ROS, Neg liver ROS,,,  Endo/Other  negative endocrine ROS    Renal/GU negative Renal ROS  negative genitourinary   Musculoskeletal negative musculoskeletal ROS (+)    Abdominal   Peds  Hematology  (+) Blood dyscrasia, anemia Hb 11.5, plt 190   Anesthesia Other Findings   Reproductive/Obstetrics (+) Pregnancy Prior section 2015, on  labor floor originally intending to labor (IOL for preE w/ SF) but pt has elected for repeat c section now                             Anesthesia Physical Anesthesia Plan  ASA: 3  Anesthesia Plan: Spinal   Post-op Pain Management: Regional block, Toradol IV (intra-op)* and Ofirmev IV (intra-op)*   Induction:   PONV Risk Score and Plan: 3 and Ondansetron, Dexamethasone and Treatment may vary due to age or medical condition  Airway Management Planned: Natural Airway and Nasal Cannula  Additional Equipment: None  Intra-op Plan:   Post-operative Plan:   Informed Consent: I have reviewed the patients History and Physical, chart, labs and discussed the procedure including the risks, benefits and alternatives for the proposed anesthesia with the patient or authorized representative who has indicated his/her understanding and acceptance.     Dental advisory given  Plan  Discussed with: CRNA  Anesthesia Plan Comments:         Anesthesia Quick Evaluation

## 2022-07-31 NOTE — Progress Notes (Signed)
Valerie Chapman is a 32 y.o. G3P1011 at [redacted]w[redacted]d admitted for induction of labor due to pre-eclampsia w/ SF.  Subjective: SVE unchanged at 3/50/-2 after AROM and pitocin. Patient desires repeat c-section as cervix unchanged. She admits to eating 8 peanut m&ms at 1200 and after consult with anesthesia must wait 6 hours as long as maternal and fetal status reassuring.   Objective: BP (!) 144/91   Pulse 63   Temp 97.7 F (36.5 C) (Oral)   Resp 18   Ht 5\' 5"  (1.651 m)   Wt 83.1 kg   LMP 11/11/2021   SpO2 99%   BMI 30.50 kg/m  I/O last 3 completed shifts: In: 3945.6 [P.O.:1525; I.V.:2420.6] Out: 5500 [Urine:5500] Total I/O In: -  Out: 1600 [Urine:1600]  FHT:  FHR: 120 bpm, variability: moderate,  accelerations:  Present,  decelerations:  Absent UC:   irregular, every 3-7 minutes SVE:   Dilation: 3 Effacement (%): 50 Station: -3 Exam by:: Dr. Alyse Low  Labs: Lab Results  Component Value Date   WBC 10.6 (H) 07/31/2022   HGB 11.5 (L) 07/31/2022   HCT 34.7 (L) 07/31/2022   MCV 91.3 07/31/2022   PLT 190 07/31/2022    Assessment / Plan: Failed induction of labor due to preeclampsia TOLAC  Labor: d/c pitocin Preeclampsia:  on magnesium sulfate Fetal Wellbeing:  Category I Pain Control:  Labor support without medications I/D:  n/a Anticipated MOD:  patient elects for repeat c-section as cervix has remained unchanged for the past 22 hr.  R/B/A discussed including but not limited to bleeding, infection, injury to surrounding organs including but not limited to bowel, bladder, and ureters. Also discussed risk of hysterectomy in case of life-threatening emergency, and death. Pt verbalizes understanding and wishes to proceed. All questions answered and informed written and verbal consent obtained. Plan to proceed with repeat c-section at 6 pm.   Jackie Plum, MD 07/31/2022, 2:33 PM

## 2022-08-01 ENCOUNTER — Encounter (HOSPITAL_COMMUNITY): Payer: Self-pay | Admitting: Obstetrics and Gynecology

## 2022-08-01 LAB — COMPREHENSIVE METABOLIC PANEL
ALT: 13 U/L (ref 0–44)
AST: 18 U/L (ref 15–41)
Albumin: 2.1 g/dL — ABNORMAL LOW (ref 3.5–5.0)
Alkaline Phosphatase: 129 U/L — ABNORMAL HIGH (ref 38–126)
Anion gap: 9 (ref 5–15)
BUN: 5 mg/dL — ABNORMAL LOW (ref 6–20)
CO2: 22 mmol/L (ref 22–32)
Calcium: 6.8 mg/dL — ABNORMAL LOW (ref 8.9–10.3)
Chloride: 99 mmol/L (ref 98–111)
Creatinine, Ser: 0.87 mg/dL (ref 0.44–1.00)
GFR, Estimated: >60 mL/min (ref >60)
Glucose, Bld: 74 mg/dL (ref 70–99)
Potassium: 4.4 mmol/L (ref 3.5–5.1)
Sodium: 130 mmol/L — ABNORMAL LOW (ref 135–145)
Total Bilirubin: 0.2 mg/dL — ABNORMAL LOW (ref 0.3–1.2)
Total Protein: 5.2 g/dL — ABNORMAL LOW (ref 6.5–8.1)

## 2022-08-01 LAB — CBC
HCT: 33.7 % — ABNORMAL LOW (ref 36.0–46.0)
Hemoglobin: 11.4 g/dL — ABNORMAL LOW (ref 12.0–15.0)
MCH: 30.6 pg (ref 26.0–34.0)
MCHC: 33.8 g/dL (ref 30.0–36.0)
MCV: 90.6 fL (ref 80.0–100.0)
Platelets: 218 10*3/uL (ref 150–400)
RBC: 3.72 MIL/uL — ABNORMAL LOW (ref 3.87–5.11)
RDW: 14 % (ref 11.5–15.5)
WBC: 13.2 10*3/uL — ABNORMAL HIGH (ref 4.0–10.5)
nRBC: 0 % (ref 0.0–0.2)

## 2022-08-01 LAB — LACTATE DEHYDROGENASE: LDH: 230 U/L — ABNORMAL HIGH (ref 98–192)

## 2022-08-01 NOTE — Progress Notes (Signed)
Subjective: POD# 1 Information for the patient's newborn:  Valerie Chapman, Valerie Chapman [[id number]]  female   Circumcision yes  Reports feeling good, HA has resolved since delivery, no visual changes or RUQ pain Reports tolerating PO and denies N/V, foley removed, ambulating and urinating w/o difficulty  Pain controlled with  PO meds Denies HA/SOB/dizziness  Flatus yes Vaginal bleeding is normal, no clots     Objective:  VS:  Vitals:   08/01/22 0424 08/01/22 0558 08/01/22 0606 08/01/22 0830  BP: 125/75   126/78  Pulse: (!) 49   (!) 51  Resp: 14 18 18 18   Temp: 98 F (36.7 C)   98.4 F (36.9 C)  TempSrc: Oral   Oral  SpO2: 97%   96%  Weight:      Height:        Intake/Output Summary (Last 24 hours) at 08/01/2022 1029 Last data filed at 08/01/2022 0800 Gross per 24 hour  Intake 3542.66 ml  Output 2482 ml  Net 1060.66 ml     Recent Labs    07/31/22 1528 08/01/22 0627  WBC 10.8* 13.2*  HGB 11.8* 11.4*  HCT 34.5* 33.7*  PLT 181 218    Blood type: --/--/A POS (04/30 1747) Rubella: Immune (11/08 0000)    Physical Exam:  General: alert, cooperative, and no distress CV: Regular rate and rhythm or without murmur or extra heart sounds Resp: clear Abdomen: soft, nontender, normal bowel sounds Incision: clean, dry, intact, and honeycomb dressing in place Perineum:  Uterine Fundus: firm, below umbilicus, nontender Lochia: minimal Ext:  trace edema, neg for pain, tenderness, and cords   Assessment/Plan: 32 y.o.   POD# 1. [id number]                  Principal Problem:   Preeclampsia, severe Active Problems:   S/P repeat low transverse C-section   Failed trial of labor following previous cesarean, delivered   Routine post-op PP care          Advance diet as tolerated Advised warm fluids and ambulation to improve GI motility Magnesium dc'd after 24 hrs Anticipate D/C POD 2 or 3  Roma Schanz, DNP, CNM 08/01/2022, 10:29 AM

## 2022-08-01 NOTE — Lactation Note (Signed)
This note was copied from a baby's chart. Lactation Consultation Note  Patient Name: Valerie Chapman Date: 08/01/2022 Age:34 hours Reason for consult: Initial assessment;Early term 37-38.6wks;Infant weight loss;Breastfeeding assistance (mom on MagSo4) Baby due to feed , LC offered to assist, mom receptive and LC changed a loose stool mec diaper.  LC placed baby STS on the  Left breast , showed mom how to hand express and baby opened wide latched with depth and fed 10 mins with swallows. Per mom comfortable. Latch score 8  Baby released at 10 mins.  LC recommended since the baby is satisfied, hold off for supplementing this feeding and allow the baby to get hungry.  LC recommended to offer the breast 1st and if still hungry after the 1st breast offer the 2nd breast if satisfied, hold off on the supplementing if baby is satisfied , if not keep the volume at 20 ml today.  LC to send a referral for [facility] .    Maternal Data Has patient been taught Hand Expression?: Yes Does the patient have breastfeeding experience prior to this delivery?: Yes How long did the patient breastfeed?: per mom attempted , milk never really came in and started supplementing early  Feeding Mother's Current Feeding Choice: Breast Milk and Formula Nipple Type: Extra Slow Flow  LATCH Score Latch: Grasps breast easily, tongue down, lips flanged, rhythmical sucking.  Audible Swallowing: A few with stimulation  Type of Nipple: Everted at rest and after stimulation (some areola edema , LC showed mom reverse pressure)  Comfort (Breast/Nipple): Soft / non-tender  Hold (Positioning): Assistance needed to correctly position infant at breast and maintain latch.  LATCH Score: 8   Lactation Tools Discussed/Used Tools: Pump;Flanges Flange Size: 21;Other (comment) (LC checked) Breast pump type: Manual;Double-Electric Breast Pump Pump Education: Setup, frequency, and cleaning;Milk Storage Reason for  Pumping: due to ET  , Mom on MagSo4 , PIH  Interventions Interventions: Breast feeding basics reviewed;Assisted with latch;Skin to skin;Breast massage;Hand express;Reverse pressure;Breast compression;Adjust position;Support pillows;Position options;Education;DEBP;LC Services brochure  Discharge Pump: Stork Pump;Manual (LC to sent a Stork Referral for mom today/ mom aware)  Consult Status Consult Status: Follow-up Date: 08/02/22 Follow-up type: In-patient    Valerie Chapman 08/01/2022, 11:06 AM

## 2022-08-02 MED ORDER — IBUPROFEN 600 MG PO TABS: 600.0000 mg | ORAL_TABLET | Freq: Four times a day (QID) | ORAL | 0 refills | Status: AC

## 2022-08-02 MED ORDER — OXYCODONE HCL 5 MG PO TABS: 5.0000 mg | ORAL_TABLET | ORAL | 0 refills | Status: AC | PRN

## 2022-08-02 MED ORDER — NIFEDIPINE ER OSMOTIC RELEASE 60 MG PO TB24
60.0000 mg | ORAL_TABLET | Freq: Every day | ORAL | Status: DC
  Administered 2022-08-02: 60 mg via ORAL
  Filled 2022-08-02: qty 1

## 2022-08-02 MED ORDER — NIFEDIPINE ER 60 MG PO TB24: 60.0000 mg | ORAL_TABLET | Freq: Every day | ORAL | 0 refills | Status: DC

## 2022-08-02 MED ORDER — NIFEDIPINE ER OSMOTIC RELEASE 30 MG PO TB24
30.0000 mg | ORAL_TABLET | Freq: Once | ORAL | Status: AC
  Administered 2022-08-02: 30 mg via ORAL
  Filled 2022-08-02: qty 1

## 2022-08-02 MED ORDER — NIFEDIPINE ER OSMOTIC RELEASE 60 MG PO TB24
90.0000 mg | ORAL_TABLET | Freq: Every day | ORAL | Status: DC
  Administered 2022-08-03: 90 mg via ORAL
  Filled 2022-08-02: qty 1

## 2022-08-02 NOTE — Lactation Note (Signed)
This note was copied from a baby's chart. Lactation Consultation Note  Patient Name: Valerie Chapman [id number] Date: 08/02/2022 Age:34 hours Reason for consult: Follow-up assessment;Early term 37-38.6wks;Breastfeeding assistance (4.41%WL)  The infant is at 59 hours old.  LC entered the room and the infant was STS with the birth parent.  She stated that she did not see a whole lot of milk coming out, but the infant is latching.  She also commented that her breasts are starting to feel fuller.  She said that she is not having any pain or discomfort with breastfeeding.  The birth parent had no questions or concerns and will call lactation for assistance with breastfeeding.   Feeding Mother's Current Feeding Choice: Breast Milk and Formula Nipple Type: Extra Slow Flow  Consult Status Consult Status: Follow-up Date: 08/03/22 Follow-up type: In-patient   Valerie Chapman 08/02/2022, 12:11 PM

## 2022-08-02 NOTE — Progress Notes (Signed)
Subjective: POD# 2 Information for the patient's newborn:  Valerie Chapman, Valerie Chapman [[id number]]  female   Baby's Name Valerie Chapman Circumcision prior to discharge  Reports feeling very good, denies HA, visual changes, and RUQ pain Feeding: breast and formula Reports tolerating PO and denies N/V, foley removed, ambulating and urinating w/o difficulty  Pain controlled with  PO meds Denies HA/SOB/dizziness  Flatus passing Vaginal bleeding is normal, no clots     Objective:  VS:  Vitals:   08/01/22 2334 08/02/22 0359 08/02/22 0420 08/02/22 0458  BP: (!) 158/79 (!) 160/94 (!) 160/83 (!) 156/77  Pulse: (!) 56 (!) 54 (!) 55 (!) 59  Resp: 19 18    Temp: 98.4 F (36.9 C) 98 F (36.7 C)    TempSrc: Oral Oral    SpO2: 98% 96%    Weight:      Height:        Intake/Output Summary (Last 24 hours) at 08/02/2022 0654 Last data filed at 08/02/2022 0457 Gross per 24 hour  Intake 1647.82 ml  Output 1800 ml  Net -152.18 ml     Recent Labs    07/31/22 1528 08/01/22 0627  WBC 10.8* 13.2*  HGB 11.8* 11.4*  HCT 34.5* 33.7*  PLT 181 218    Blood type: --/--/A POS (04/30 1747) Rubella: Immune (11/08 0000)    Physical Exam:  General: alert, cooperative, and no distress Resp: unlabored Abdomen: soft, non-distended Incision: clean, dry, intact, and honeycomb dressing Perineum:  Uterine Fundus: firm, below umbilicus, nontender Lochia: minimal Ext:  no edema, neg for tenderness, pain, and cords   Assessment/Plan: 32 y.o.   POD# 2. [id number]                  Principal Problem:   Preeclampsia, severe Active Problems:   S/P repeat low transverse C-section   Failed trial of labor following previous cesarean, delivered  Continue procardia XL 60 mg PO Continue routine post-op PP care          Breastfeeding support Anticipate D/C POD #3  Roma Schanz, DNP, CNM 08/02/2022, 6:54 AM

## 2022-08-02 NOTE — Progress Notes (Signed)
Delay regarding treatment of severe range BP with IV medication due to loss of IV access. Patient stated that infant grabbed IV and pulled it out. IV was inserted and medication given.

## 2022-08-03 MED ORDER — HYDROCHLOROTHIAZIDE 25 MG PO TABS
25.0000 mg | ORAL_TABLET | Freq: Every day | ORAL | Status: DC
  Administered 2022-08-03 – 2022-08-04 (×2): 25 mg via ORAL
  Filled 2022-08-03 (×2): qty 1

## 2022-08-03 MED ORDER — NIFEDIPINE ER OSMOTIC RELEASE 60 MG PO TB24
120.0000 mg | ORAL_TABLET | Freq: Every day | ORAL | Status: DC
  Administered 2022-08-04: 120 mg via ORAL
  Filled 2022-08-03: qty 2

## 2022-08-03 MED ORDER — NIFEDIPINE ER OSMOTIC RELEASE 30 MG PO TB24
30.0000 mg | ORAL_TABLET | Freq: Once | ORAL | Status: AC
  Administered 2022-08-03: 30 mg via ORAL
  Filled 2022-08-03: qty 1

## 2022-08-03 NOTE — Lactation Note (Signed)
This note was copied from a baby's chart. Lactation Consultation Note  Patient Name: Valerie Chapman [id number] Date: 08/03/2022 Age:34 hours Reason for consult: Follow-up assessment;Early term 37-38.6wks;Infant weight loss (- 4.41% weight loss)  Visited with family of 30 hours old ETI female; [name] is a P2 but not very experienced breastfeeding. She reported that breastfeeding is going well, baby has been latching on but not at every feeding, family has been mostly supplementing with Similac 20 calorie formula. DEBP has been set up in the room but she hasn't had a chance to use it the last two days. Explained the importance of consistent latching/pumping for the onset of lactogenesis II and the prevention of engorgement. [name] voiced she feels her breast getting full; OB Specialty care RN Marchelle Folks came in the room during [facility] consultation and also encouraged patient to pump due to provider's recommendations. Reviewed normal newborn behavior, feeding cues, engorgement prevention/treatment, pumping schedule, lactogenesis II and anticipatory guidelines.  Feeding Mother's Current Feeding Choice: Breast Milk and Formula Nipple Type: Slow - flow  Lactation Tools Discussed/Used Tools: Pump;Flanges Flange Size: 21 Breast pump type: Double-Electric Breast Pump Pump Education: Setup, frequency, and cleaning;Milk Storage Reason for Pumping: ETI, supplementation Pumping frequency: has not pumped the last two days Pumped volume: 0 mL  Interventions Interventions: Breast feeding basics reviewed;DEBP;Education  Discharge Discharge Education: Engorgement and breast care Pump: Manual;Stork Pump (Spectra)  Plan Encouraged to start putting baby to breast 8 times/24 hours or sooner if feeding cues are present Pumping whenever baby is getting a bottle was also strongly encouraged Parents will continue supplementing with Similac 20 calorie formula per parental choice  FOB present. All questions and  concerns answered, family to contact [facility] services PRN.  Consult Status Consult Status: Follow-up Date: 08/04/22 Follow-up type: In-patient   Kurt Hoffmeier Venetia Constable 08/03/2022, 3:16 PM

## 2022-08-03 NOTE — Progress Notes (Signed)
Subjective: Postpartum Day 3: Cesarean Delivery Patient reports incisional pain, tolerating PO, and no problems voiding.  Reports slight headache no visual disturbances no ruq pain  patient strongly desires discharge home today . She required IV antihypertensive for bp control over night. .   Objective: Vital signs in last 24 hours: Temp:  [97.7 F (36.5 C)-99.1 F (37.3 C)] 97.7 F (36.5 C) (05/05 0415) Pulse Rate:  [52-65] 64 (05/05 0415) Resp:  [17-18] 18 (05/05 0415) BP: (147-178)/(65-94) 152/86 (05/05 0415) SpO2:  [95 %-99 %] 98 % (05/05 0048)  Physical Exam:  General: alert, cooperative, and no distress Lochia: appropriate Uterine Fundus: firm Incision: healing well DVT Evaluation: No evidence of DVT seen on physical exam.  Recent Labs    07/31/22 1528 08/01/22 0627  HGB 11.8* 11.4*  HCT 34.5* 33.7*    Assessment/Plan: Status post Cesarean section. Doing well postoperatively.  Preeclampsia with severe features. HCTZ 25 mg added to bp regimen to start this am at 10 am. Pt to receive 90 mg of procardia at 10 am. If bp is not severe range the rest of the day will d/c home with bp check in the office tomorrow. Gerald Leitz, MD 08/03/2022, 9:02 AM

## 2022-08-04 LAB — SURGICAL PATHOLOGY

## 2022-08-04 MED ORDER — LABETALOL HCL 200 MG PO TABS: 200.0000 mg | ORAL_TABLET | Freq: Two times a day (BID) | ORAL | 1 refills | Status: AC

## 2022-08-04 MED ORDER — MEDROXYPROGESTERONE ACETATE 150 MG/ML IM SUSP
150.0000 mg | Freq: Once | INTRAMUSCULAR | Status: AC
  Administered 2022-08-04: 150 mg via INTRAMUSCULAR
  Filled 2022-08-04: qty 1

## 2022-08-04 MED ORDER — NIFEDIPINE ER 60 MG PO TB24: 120.0000 mg | ORAL_TABLET | Freq: Every day | ORAL | 1 refills | Status: AC

## 2022-08-04 MED ORDER — LABETALOL HCL 200 MG PO TABS
200.0000 mg | ORAL_TABLET | Freq: Two times a day (BID) | ORAL | Status: DC
  Administered 2022-08-04: 200 mg via ORAL
  Filled 2022-08-04: qty 1

## 2022-08-04 MED ORDER — HYDRALAZINE HCL 10 MG PO TABS
10.0000 mg | ORAL_TABLET | Freq: Three times a day (TID) | ORAL | Status: DC
  Administered 2022-08-04 (×2): 10 mg via ORAL
  Filled 2022-08-04 (×2): qty 1

## 2022-08-04 NOTE — Plan of Care (Signed)
Problem: Education: Goal: Ability to make informed decisions regarding treatment and plan of care will improve Outcome: Adequate for Discharge Goal: Ability to state and carry out methods to decrease the pain will improve Outcome: Adequate for Discharge Goal: Individualized Educational Video(s) Outcome: Adequate for Discharge   Problem: Coping: Goal: Ability to verbalize concerns and feelings about labor and delivery will improve Outcome: Adequate for Discharge   Problem: Life Cycle: Goal: Ability to make normal progression through stages of labor will improve Outcome: Adequate for Discharge Goal: Ability to effectively push during vaginal delivery will improve Outcome: Adequate for Discharge   Problem: Role Relationship: Goal: Will demonstrate positive interactions with the child Outcome: Adequate for Discharge   Problem: Safety: Goal: Risk of complications during labor and delivery will decrease Outcome: Adequate for Discharge   Problem: Pain Management: Goal: Relief or control of pain from uterine contractions will improve Outcome: Adequate for Discharge   Problem: Education: Goal: Knowledge of disease or condition will improve Outcome: Adequate for Discharge Goal: Knowledge of the prescribed therapeutic regimen will improve Outcome: Adequate for Discharge   Problem: Fluid Volume: Goal: Peripheral tissue perfusion will improve Outcome: Adequate for Discharge   Problem: Clinical Measurements: Goal: Complications related to disease process, condition or treatment will be avoided or minimized Outcome: Adequate for Discharge   Problem: Education: Goal: Knowledge of General Education information will improve Description: Including pain rating scale, medication(s)/side effects and non-pharmacologic comfort measures Outcome: Adequate for Discharge   Problem: Health Behavior/Discharge Planning: Goal: Ability to manage health-related needs will improve Outcome: Adequate  for Discharge   Problem: Clinical Measurements: Goal: Ability to maintain clinical measurements within normal limits will improve Outcome: Adequate for Discharge Goal: Will remain free from infection Outcome: Adequate for Discharge Goal: Diagnostic test results will improve Outcome: Adequate for Discharge Goal: Respiratory complications will improve Outcome: Adequate for Discharge Goal: Cardiovascular complication will be avoided Outcome: Adequate for Discharge   Problem: Activity: Goal: Risk for activity intolerance will decrease Outcome: Adequate for Discharge   Problem: Nutrition: Goal: Adequate nutrition will be maintained Outcome: Adequate for Discharge   Problem: Coping: Goal: Level of anxiety will decrease Outcome: Adequate for Discharge   Problem: Elimination: Goal: Will not experience complications related to bowel motility Outcome: Adequate for Discharge Goal: Will not experience complications related to urinary retention Outcome: Adequate for Discharge   Problem: Pain Managment: Goal: General experience of comfort will improve Outcome: Adequate for Discharge   Problem: Safety: Goal: Ability to remain free from injury will improve Outcome: Adequate for Discharge   Problem: Skin Integrity: Goal: Risk for impaired skin integrity will decrease Outcome: Adequate for Discharge   Problem: Education: Goal: Knowledge of the prescribed therapeutic regimen will improve Outcome: Adequate for Discharge Goal: Understanding of sexual limitations or changes related to disease process or condition will improve Outcome: Adequate for Discharge Goal: Individualized Educational Video(s) Outcome: Adequate for Discharge   Problem: Self-Concept: Goal: Communication of feelings regarding changes in body function or appearance will improve Outcome: Adequate for Discharge   Problem: Skin Integrity: Goal: Demonstration of wound healing without infection will improve Outcome:  Adequate for Discharge   Problem: Education: Goal: Knowledge of condition will improve Outcome: Adequate for Discharge Goal: Individualized Educational Video(s) Outcome: Adequate for Discharge Goal: Individualized Newborn Educational Video(s) Outcome: Adequate for Discharge   Problem: Activity: Goal: Will verbalize the importance of balancing activity with adequate rest periods Outcome: Adequate for Discharge Goal: Ability to tolerate increased activity will improve Outcome: Adequate for Discharge  Problem: Coping: Goal: Ability to identify and utilize available resources and services will improve Outcome: Adequate for Discharge   Problem: Life Cycle: Goal: Chance of risk for complications during the postpartum period will decrease Outcome: Adequate for Discharge   Problem: Role Relationship: Goal: Ability to demonstrate positive interaction with newborn will improve Outcome: Adequate for Discharge   Problem: Skin Integrity: Goal: Demonstration of wound healing without infection will improve Outcome: Adequate for Discharge

## 2022-08-04 NOTE — Plan of Care (Signed)
Problem: Education: Goal: Knowledge of Childbirth will improve Outcome: Completed/Met Goal: Ability to make informed decisions regarding treatment and plan of care will improve 08/04/2022 1629 by Samuella Cota, RN Outcome: Completed/Met 08/04/2022 1628 by Samuella Cota, RN Outcome: Adequate for Discharge Goal: Ability to state and carry out methods to decrease the pain will improve 08/04/2022 1629 by Samuella Cota, RN Outcome: Completed/Met 08/04/2022 1628 by Samuella Cota, RN Outcome: Adequate for Discharge Goal: Individualized Educational Video(s) 08/04/2022 1629 by Samuella Cota, RN Outcome: Completed/Met 08/04/2022 1628 by Samuella Cota, RN Outcome: Adequate for Discharge   Problem: Coping: Goal: Ability to verbalize concerns and feelings about labor and delivery will improve 08/04/2022 1629 by Samuella Cota, RN Outcome: Completed/Met 08/04/2022 1628 by Samuella Cota, RN Outcome: Adequate for Discharge   Problem: Life Cycle: Goal: Ability to make normal progression through stages of labor will improve 08/04/2022 1629 by Samuella Cota, RN Outcome: Completed/Met 08/04/2022 1628 by Samuella Cota, RN Outcome: Adequate for Discharge Goal: Ability to effectively push during vaginal delivery will improve 08/04/2022 1629 by Samuella Cota, RN Outcome: Completed/Met 08/04/2022 1628 by Samuella Cota, RN Outcome: Adequate for Discharge   Problem: Role Relationship: Goal: Will demonstrate positive interactions with the child 08/04/2022 1629 by Samuella Cota, RN Outcome: Completed/Met 08/04/2022 1628 by Samuella Cota, RN Outcome: Adequate for Discharge   Problem: Safety: Goal: Risk of complications during labor and delivery will decrease 08/04/2022 1629 by Samuella Cota, RN Outcome: Completed/Met 08/04/2022 1628 by Samuella Cota, RN Outcome: Adequate for Discharge   Problem: Pain Management: Goal: Relief or control of pain from uterine contractions will improve 08/04/2022 1629 by Samuella Cota,  RN Outcome: Completed/Met 08/04/2022 1628 by Samuella Cota, RN Outcome: Adequate for Discharge   Problem: Education: Goal: Knowledge of disease or condition will improve 08/04/2022 1629 by Samuella Cota, RN Outcome: Completed/Met 08/04/2022 1628 by Samuella Cota, RN Outcome: Adequate for Discharge Goal: Knowledge of the prescribed therapeutic regimen will improve 08/04/2022 1629 by Samuella Cota, RN Outcome: Completed/Met 08/04/2022 1628 by Samuella Cota, RN Outcome: Adequate for Discharge   Problem: Fluid Volume: Goal: Peripheral tissue perfusion will improve 08/04/2022 1629 by Samuella Cota, RN Outcome: Completed/Met 08/04/2022 1628 by Samuella Cota, RN Outcome: Adequate for Discharge   Problem: Clinical Measurements: Goal: Complications related to disease process, condition or treatment will be avoided or minimized 08/04/2022 1629 by Samuella Cota, RN Outcome: Completed/Met 08/04/2022 1628 by Samuella Cota, RN Outcome: Adequate for Discharge   Problem: Education: Goal: Knowledge of General Education information will improve Description: Including pain rating scale, medication(s)/side effects and non-pharmacologic comfort measures 08/04/2022 1629 by Samuella Cota, RN Outcome: Completed/Met 08/04/2022 1628 by Samuella Cota, RN Outcome: Adequate for Discharge   Problem: Health Behavior/Discharge Planning: Goal: Ability to manage health-related needs will improve 08/04/2022 1629 by Samuella Cota, RN Outcome: Completed/Met 08/04/2022 1628 by Samuella Cota, RN Outcome: Adequate for Discharge   Problem: Clinical Measurements: Goal: Ability to maintain clinical measurements within normal limits will improve 08/04/2022 1629 by Samuella Cota, RN Outcome: Completed/Met 08/04/2022 1628 by Samuella Cota, RN Outcome: Adequate for Discharge Goal: Will remain free from infection 08/04/2022 1629 by Samuella Cota, RN Outcome: Completed/Met 08/04/2022 1628 by Samuella Cota, RN Outcome: Adequate for  Discharge Goal: Diagnostic test results will improve 08/04/2022 1629 by Samuella Cota, RN Outcome: Completed/Met 08/04/2022 1628 by Felizardo Hoffmann,  Stan Head, RN Outcome: Adequate for Discharge Goal: Respiratory complications will improve 08/04/2022 1629 by Samuella Cota, RN Outcome: Completed/Met 08/04/2022 1628 by Samuella Cota, RN Outcome: Adequate for Discharge Goal: Cardiovascular complication will be avoided 08/04/2022 1629 by Samuella Cota, RN Outcome: Completed/Met 08/04/2022 1628 by Samuella Cota, RN Outcome: Adequate for Discharge   Problem: Activity: Goal: Risk for activity intolerance will decrease 08/04/2022 1629 by Samuella Cota, RN Outcome: Completed/Met 08/04/2022 1628 by Samuella Cota, RN Outcome: Adequate for Discharge   Problem: Nutrition: Goal: Adequate nutrition will be maintained 08/04/2022 1629 by Samuella Cota, RN Outcome: Completed/Met 08/04/2022 1628 by Samuella Cota, RN Outcome: Adequate for Discharge   Problem: Coping: Goal: Level of anxiety will decrease 08/04/2022 1629 by Samuella Cota, RN Outcome: Completed/Met 08/04/2022 1628 by Samuella Cota, RN Outcome: Adequate for Discharge   Problem: Elimination: Goal: Will not experience complications related to bowel motility 08/04/2022 1629 by Samuella Cota, RN Outcome: Completed/Met 08/04/2022 1628 by Samuella Cota, RN Outcome: Adequate for Discharge Goal: Will not experience complications related to urinary retention 08/04/2022 1629 by Samuella Cota, RN Outcome: Completed/Met 08/04/2022 1628 by Samuella Cota, RN Outcome: Adequate for Discharge   Problem: Pain Managment: Goal: General experience of comfort will improve 08/04/2022 1629 by Samuella Cota, RN Outcome: Completed/Met 08/04/2022 1628 by Samuella Cota, RN Outcome: Adequate for Discharge   Problem: Safety: Goal: Ability to remain free from injury will improve 08/04/2022 1629 by Samuella Cota, RN Outcome: Completed/Met 08/04/2022 1628 by Samuella Cota, RN Outcome: Adequate  for Discharge   Problem: Skin Integrity: Goal: Risk for impaired skin integrity will decrease 08/04/2022 1629 by Samuella Cota, RN Outcome: Completed/Met 08/04/2022 1628 by Samuella Cota, RN Outcome: Adequate for Discharge   Problem: Education: Goal: Knowledge of the prescribed therapeutic regimen will improve 08/04/2022 1629 by Samuella Cota, RN Outcome: Completed/Met 08/04/2022 1628 by Samuella Cota, RN Outcome: Adequate for Discharge Goal: Understanding of sexual limitations or changes related to disease process or condition will improve 08/04/2022 1629 by Samuella Cota, RN Outcome: Completed/Met 08/04/2022 1628 by Samuella Cota, RN Outcome: Adequate for Discharge Goal: Individualized Educational Video(s) 08/04/2022 1629 by Samuella Cota, RN Outcome: Completed/Met 08/04/2022 1628 by Samuella Cota, RN Outcome: Adequate for Discharge   Problem: Self-Concept: Goal: Communication of feelings regarding changes in body function or appearance will improve 08/04/2022 1629 by Samuella Cota, RN Outcome: Completed/Met 08/04/2022 1628 by Samuella Cota, RN Outcome: Adequate for Discharge   Problem: Skin Integrity: Goal: Demonstration of wound healing without infection will improve 08/04/2022 1629 by Samuella Cota, RN Outcome: Completed/Met 08/04/2022 1628 by Samuella Cota, RN Outcome: Adequate for Discharge   Problem: Education: Goal: Knowledge of condition will improve 08/04/2022 1629 by Samuella Cota, RN Outcome: Completed/Met 08/04/2022 1628 by Samuella Cota, RN Outcome: Adequate for Discharge Goal: Individualized Educational Video(s) 08/04/2022 1629 by Samuella Cota, RN Outcome: Completed/Met 08/04/2022 1628 by Samuella Cota, RN Outcome: Adequate for Discharge Goal: Individualized Newborn Educational Video(s) 08/04/2022 1629 by Samuella Cota, RN Outcome: Completed/Met 08/04/2022 1628 by Samuella Cota, RN Outcome: Adequate for Discharge   Problem: Activity: Goal: Will verbalize the importance of  balancing activity with adequate rest periods 08/04/2022 1629 by Samuella Cota, RN Outcome: Completed/Met 08/04/2022 1628 by Samuella Cota, RN Outcome: Adequate for Discharge Goal: Ability to tolerate increased activity will improve 08/04/2022 1629 by  Samuella Cota, RN Outcome: Completed/Met 08/04/2022 1628 by Samuella Cota, RN Outcome: Adequate for Discharge   Problem: Coping: Goal: Ability to identify and utilize available resources and services will improve 08/04/2022 1629 by Samuella Cota, RN Outcome: Completed/Met 08/04/2022 1628 by Samuella Cota, RN Outcome: Adequate for Discharge   Problem: Life Cycle: Goal: Chance of risk for complications during the postpartum period will decrease 08/04/2022 1629 by Samuella Cota, RN Outcome: Completed/Met 08/04/2022 1628 by Samuella Cota, RN Outcome: Adequate for Discharge   Problem: Role Relationship: Goal: Ability to demonstrate positive interaction with newborn will improve 08/04/2022 1629 by Samuella Cota, RN Outcome: Completed/Met 08/04/2022 1628 by Samuella Cota, RN Outcome: Adequate for Discharge   Problem: Skin Integrity: Goal: Demonstration of wound healing without infection will improve 08/04/2022 1629 by Samuella Cota, RN Outcome: Completed/Met 08/04/2022 1628 by Samuella Cota, RN Outcome: Adequate for Discharge

## 2022-08-04 NOTE — Discharge Summary (Signed)
Postpartum Discharge Summary  Date of Service updated 08/04/2022     Patient Name: Valerie Chapman DOB: Jan 24, 1991 MRN: [id number]  Date of admission: 07/29/2022 Delivery date:07/31/2022  Delivering provider: Reesa Chew D  Date of discharge: 08/04/2022  Admitting diagnosis: Preeclampsia, severe [O14.10] Intrauterine pregnancy: [redacted]w[redacted]d     Secondary diagnosis:  Principal Problem:   Preeclampsia, severe Active Problems:   S/P repeat low transverse C-section   Failed trial of labor following previous cesarean, delivered  Additional problems: PP control of BP    Discharge diagnosis: Term Pregnancy Delivered and Preeclampsia (severe)                                              Post partum procedures: BP medication mgmt Augmentation: Pitocin Complications: None  Hospital course: Induction of Labor With Cesarean Section   32 y.o. yo [id number] at [redacted]w[redacted]d was admitted to the hospital 07/29/2022 for induction of labor. Patient had a labor course significant for failed IOL. The patient went for cesarean section due to  failed IOL and repeat . Delivery details are as follows: Membrane Rupture Time/Date: 8:53 AM ,07/31/2022   Delivery Method:C-Section, Low Transverse  Details of operation can be found in separate operative Note.  Patient had a postpartum course complicated by uncontrolled BP. She is ambulating, tolerating a regular diet, passing flatus, and urinating well.  Patient is discharged home in stable condition on 08/04/22.      Newborn Data: Birth date:07/31/2022  Birth time:6:26 PM  Gender:Female  Living status:Living  Apgars:8 ,9  Weight:3040 g                                Magnesium Sulfate received: Yes: Seizure prophylaxis BMZ received: No Rhophylac:No T-DaP: see prenatal record Transfusion:No  Physical exam  Vitals:   08/04/22 0454 08/04/22 0812 08/04/22 1235 08/04/22 1559  BP: (!) 158/94 (!) 147/94 (!) 159/92 132/78  Pulse: (!) 59 63 70 70  Resp: 18 18 18 18   Temp:  98 F (36.7 C) 98.3 F (36.8 C) 99.3 F (37.4 C) 98.3 F (36.8 C)  TempSrc: Oral Oral Oral Oral  SpO2: 98% 98% 99% 97%  Weight:      Height:       General: alert and no distress Lochia: appropriate Uterine Fundus: FF, NT Incision: honeycomb dressing in place c/d DVT Evaluation: No evidence of DVT seen on physical exam. Labs: Lab Results  Component Value Date   WBC 13.2 (H) 08/01/2022   HGB 11.4 (L) 08/01/2022   HCT 33.7 (L) 08/01/2022   MCV 90.6 08/01/2022   PLT 218 08/01/2022      Latest Ref Rng & Units 08/01/2022    6:27 AM  CMP  Glucose 70 - 99 mg/dL 74   BUN 6 - 20 mg/dL 5   Creatinine 1.47 - 8.29 mg/dL 5.62   Sodium 130 - 865 mmol/L 130   Potassium 3.5 - 5.1 mmol/L 4.4   Chloride 98 - 111 mmol/L 99   CO2 22 - 32 mmol/L 22   Calcium 8.9 - 10.3 mg/dL 6.8   Total Protein 6.5 - 8.1 g/dL 5.2   Total Bilirubin 0.3 - 1.2 mg/dL 0.2   Alkaline Phos 38 - 126 U/L 129   AST 15 - 41 U/L 18   ALT  0 - 44 U/L 13    Edinburgh Score:    08/02/2022   12:00 PM  Edinburgh Postnatal Depression Scale Screening Tool  I have been able to laugh and see the funny side of things. 0  I have looked forward with enjoyment to things. 0  I have blamed myself unnecessarily when things went wrong. 2  I have been anxious or worried for no good reason. 2  I have felt scared or panicky for no good reason. 1  Things have been getting on top of me. 1  I have been so unhappy that I have had difficulty sleeping. 0  I have felt sad or miserable. 1  I have been so unhappy that I have been crying. 0  The thought of harming myself has occurred to me. 0  Edinburgh Postnatal Depression Scale Total 7      After visit meds:  Allergies as of 08/04/2022   No Known Allergies      Medication List     STOP taking these medications    ferrous sulfate 325 (65 FE) MG tablet       TAKE these medications    acetaminophen 325 MG tablet Commonly known as: TYLENOL Take 650 mg by mouth every 6 (six)  hours as needed.   ibuprofen 600 MG tablet Commonly known as: ADVIL Take 1 tablet (600 mg total) by mouth every 6 (six) hours.   labetalol 200 MG tablet Commonly known as: NORMODYNE Take 1 tablet (200 mg total) by mouth 2 (two) times daily.   multivitamin-prenatal 27-0.8 MG Tabs tablet Take 1 tablet by mouth daily at 12 noon.   NIFEdipine 60 MG 24 hr tablet Commonly known as: ADALAT CC Take 2 tablets (120 mg total) by mouth daily. Start taking on: Aug 05, 2022   oxyCODONE 5 MG immediate release tablet Commonly known as: Oxy IR/ROXICODONE Take 1-2 tablets (5-10 mg total) by mouth every 4 (four) hours as needed for moderate pain.         Discharge home in stable condition Infant Feeding:  unsure Infant Disposition:home with mother Discharge instruction: per After Visit Summary and Postpartum booklet. Activity: Advance as tolerated. Pelvic rest for 6 weeks.  Diet: routine diet Anticipated Birth Control: PP Depo given Postpartum Appointment: Friday 08/08/22 at 10:30a Additional Postpartum F/U: BP check on Friday 08/08/22 at 10:30a Future Appointments:No future appointments. Follow up Visit:  Follow-up Information     Jackie Plum, MD. Schedule an appointment as soon as possible for a visit in 6 week(s).   Specialty: Obstetrics and Gynecology Contact information: [address], [address] [place] [address] [phone]         Osborn Coho, MD. Nyra Capes on 08/08/2022.   Specialty: Obstetrics and Gynecology Why: For Postpartum follow-up and check BP on Friday 08/08/2022 at 10:30am. Contact information: [address] [place] [address] [phone]                     08/04/2022 Purcell Nails, MD

## 2022-08-12 ENCOUNTER — Telehealth (HOSPITAL_COMMUNITY): Payer: Self-pay | Admitting: *Deleted

## 2022-08-12 NOTE — Telephone Encounter (Signed)
Mom reports feeling good. Incision healing well per mom. No concerns regarding herself at this time. EPDS not completed as patient reports it was repeated in office this week. (hospital score=7) Mom reports baby is well. Feeding, peeing, and pooping without difficulty. Reviewed safe sleep. Mom has no concerns about baby at present.  Duffy Rhody, RN 08-12-2022 at 12:06pm

## 2023-02-08 ENCOUNTER — Other Ambulatory Visit: Payer: Self-pay
